# Patient Record
Sex: Female | Born: 1970 | Race: White | Hispanic: Yes | Marital: Married | Smoking: Never smoker
Health system: Southern US, Community
[De-identification: ages and names within clinical notes are randomized; demographics above are authoritative.]

## PROBLEM LIST (undated history)

## (undated) DIAGNOSIS — G40909 Epilepsy, unspecified, not intractable, without status epilepticus: Secondary | ICD-10-CM

## (undated) DIAGNOSIS — R569 Unspecified convulsions: Secondary | ICD-10-CM

## (undated) HISTORY — DX: Epilepsy, unspecified, not intractable, without status epilepticus: G40.909

## (undated) HISTORY — PX: NO PAST SURGERIES: SHX2092

---

## 2005-04-19 ENCOUNTER — Ambulatory Visit: Payer: Self-pay | Admitting: *Deleted

## 2005-04-19 ENCOUNTER — Encounter (INDEPENDENT_AMBULATORY_CARE_PROVIDER_SITE_OTHER): Payer: Self-pay | Admitting: *Deleted

## 2005-04-21 ENCOUNTER — Ambulatory Visit: Payer: Self-pay | Admitting: *Deleted

## 2005-04-21 ENCOUNTER — Ambulatory Visit (HOSPITAL_COMMUNITY): Admission: RE | Admit: 2005-04-21 | Discharge: 2005-04-21 | Payer: Self-pay | Admitting: *Deleted

## 2005-04-26 ENCOUNTER — Ambulatory Visit (HOSPITAL_COMMUNITY): Admission: RE | Admit: 2005-04-26 | Discharge: 2005-04-26 | Payer: Self-pay | Admitting: *Deleted

## 2005-05-02 ENCOUNTER — Inpatient Hospital Stay (HOSPITAL_COMMUNITY): Admission: AD | Admit: 2005-05-02 | Discharge: 2005-05-05 | Payer: Self-pay | Admitting: Obstetrics and Gynecology

## 2005-05-03 ENCOUNTER — Encounter (INDEPENDENT_AMBULATORY_CARE_PROVIDER_SITE_OTHER): Payer: Self-pay | Admitting: Specialist

## 2005-05-03 ENCOUNTER — Encounter: Payer: Self-pay | Admitting: Neurology

## 2005-05-14 ENCOUNTER — Ambulatory Visit: Payer: Self-pay | Admitting: Certified Nurse Midwife

## 2005-05-14 ENCOUNTER — Inpatient Hospital Stay (HOSPITAL_COMMUNITY): Admission: AD | Admit: 2005-05-14 | Discharge: 2005-05-20 | Payer: Self-pay | Admitting: Family Medicine

## 2005-05-15 ENCOUNTER — Ambulatory Visit: Payer: Self-pay | Admitting: Critical Care Medicine

## 2005-05-18 ENCOUNTER — Ambulatory Visit: Payer: Self-pay | Admitting: Certified Nurse Midwife

## 2005-05-24 ENCOUNTER — Ambulatory Visit: Payer: Self-pay | Admitting: Family Medicine

## 2005-06-28 ENCOUNTER — Ambulatory Visit: Payer: Self-pay | Admitting: Obstetrics & Gynecology

## 2007-02-16 ENCOUNTER — Emergency Department (HOSPITAL_COMMUNITY): Admission: EM | Admit: 2007-02-16 | Discharge: 2007-02-16 | Payer: Self-pay | Admitting: Emergency Medicine

## 2007-05-24 IMAGING — US US OB COMP +14 WK
1 series · 12 of 28 positions shown · non-contrast
Comparison: none

CLINICAL DATA: Anatomic exam.

[Series 1: us ob comp +14 wk · 0.33mm/px · 12 of 76 slices shown]
[im 3/76]
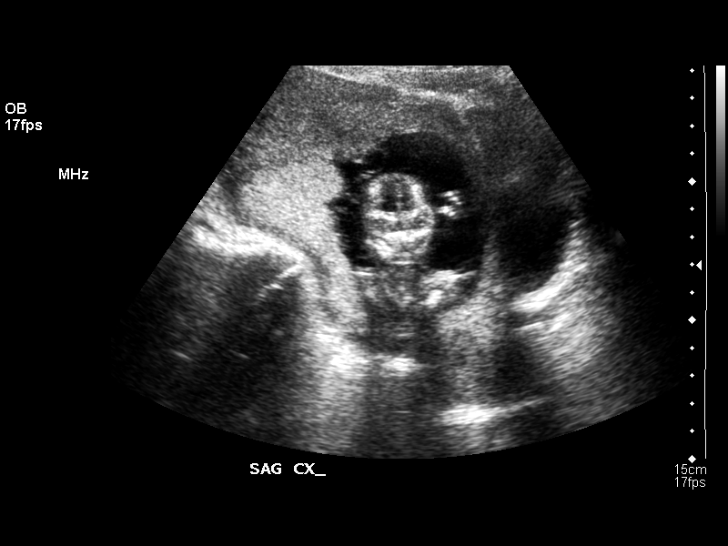
[im 9/76]
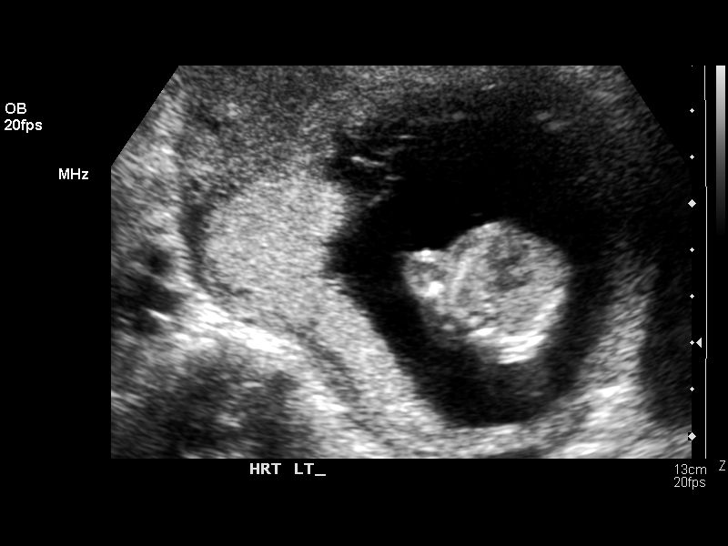
[im 14/76]
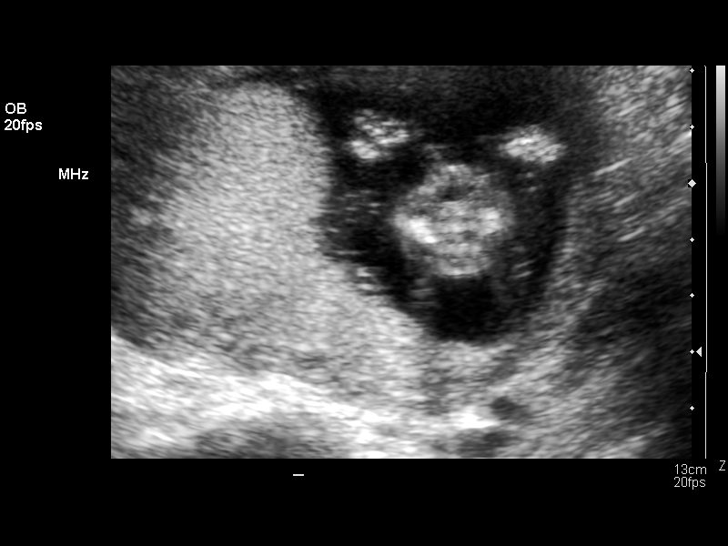
[im 23/76]
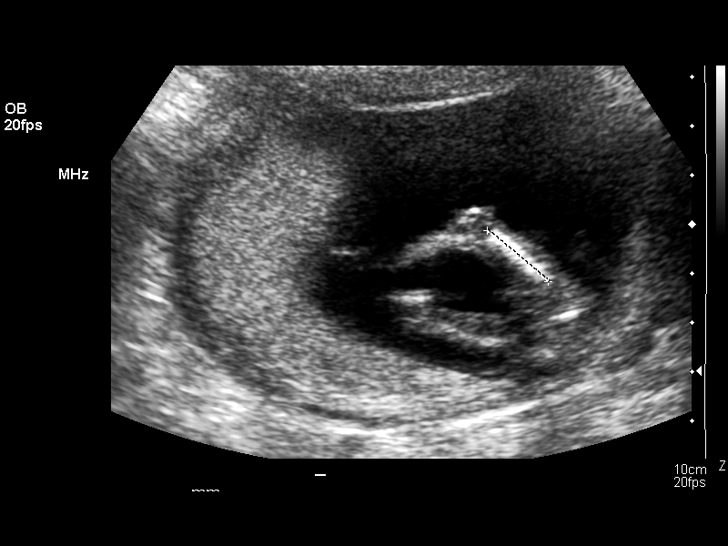
[im 28/76]
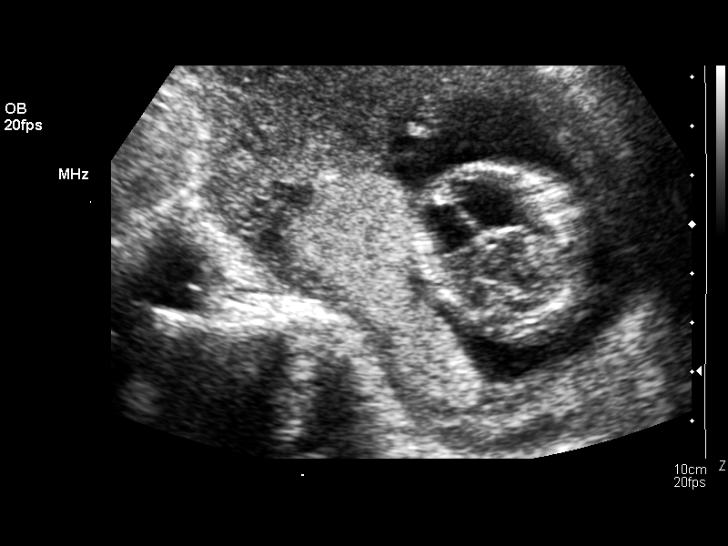
[im 34/76]
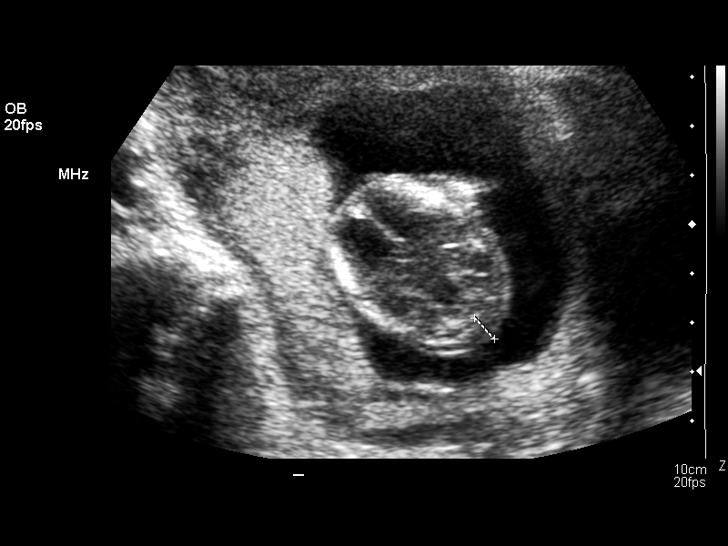
[im 42/76]
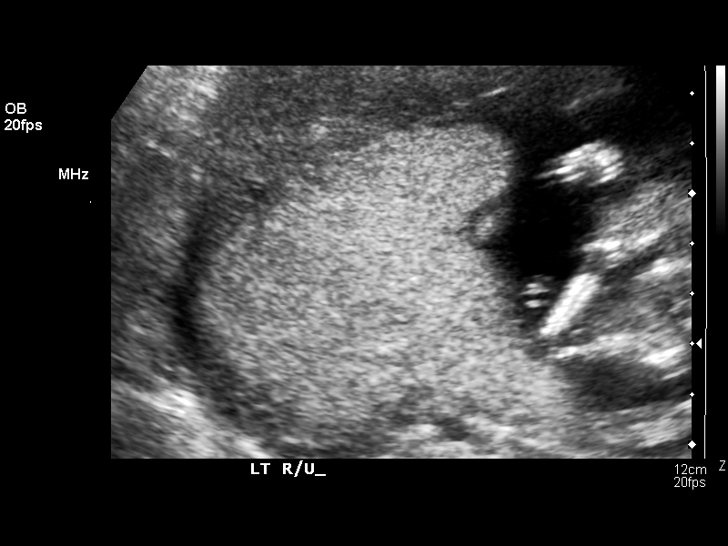
[im 48/76]
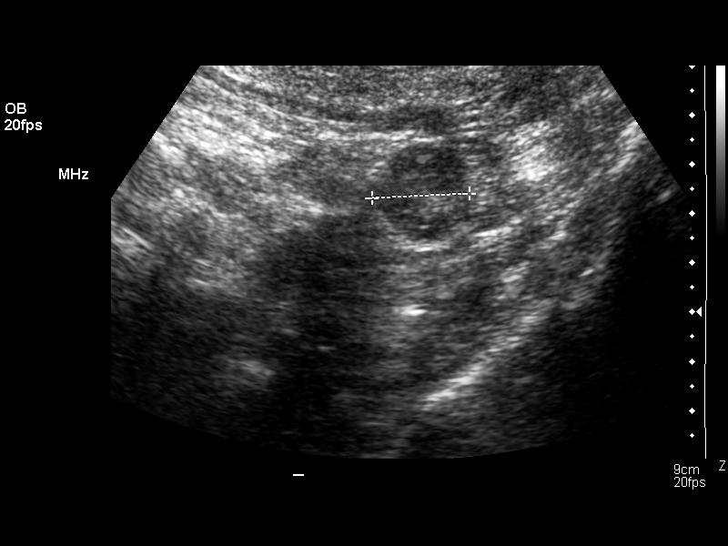
[im 53/76]
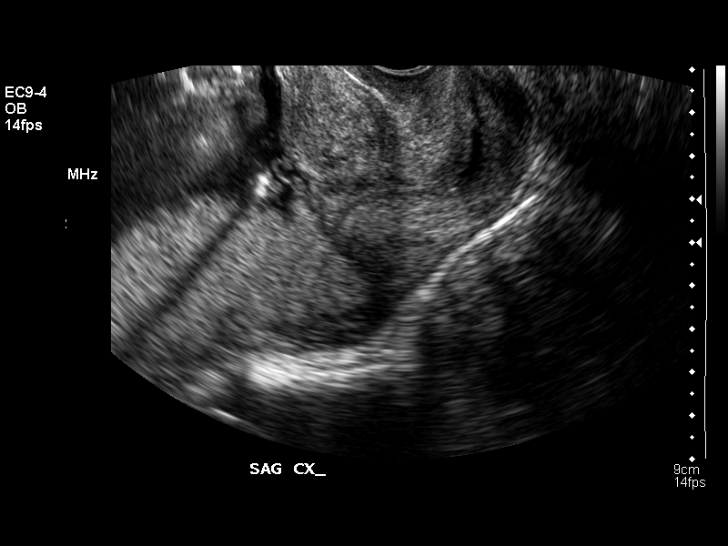
[im 62/76]
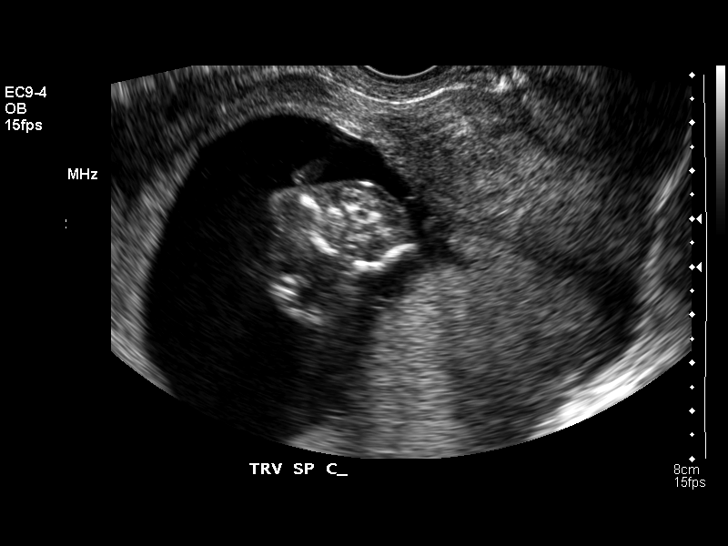
[im 67/76]
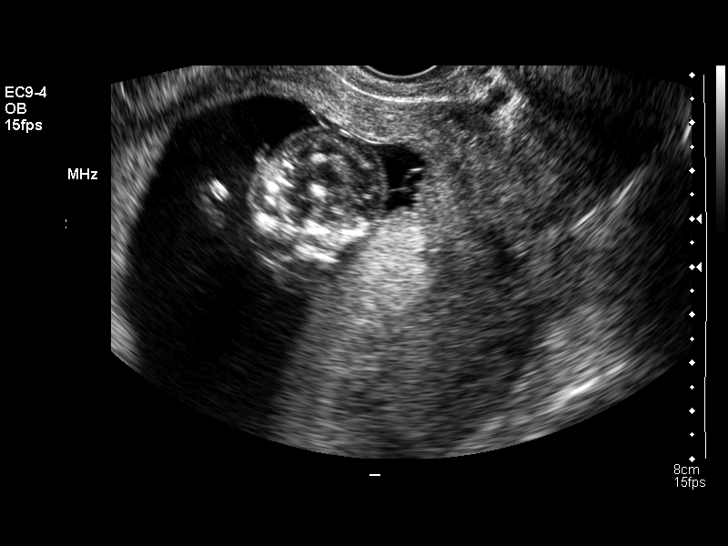
[im 73/76]
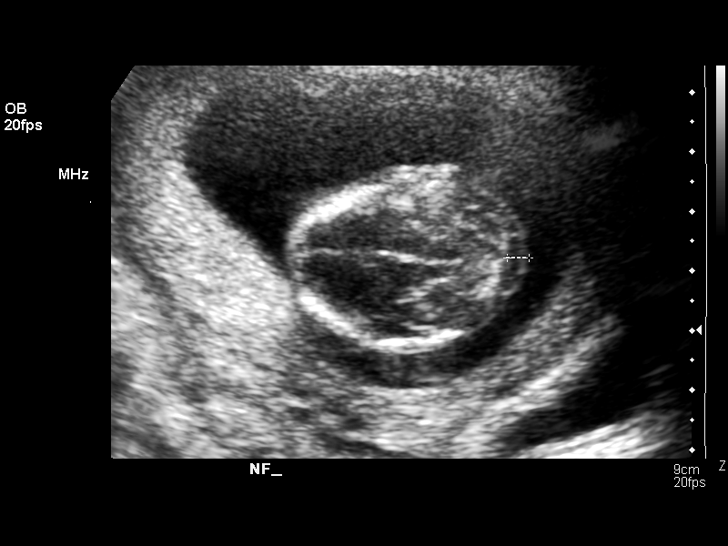

[12 of 28 positions shown; findings below may reference images not displayed]

OBSTETRICAL ULTRASOUND AND TRANSVAGINAL OB US:
Number of Fetuses:  1
Heart Rate:  153
Movement:  Yes
Breathing:  No  
Presentation:  Variable
Placental Location:  Posterior
Grade:  I
Previa:  No
Amniotic Fluid (Subjective):  Normal
Amniotic Fluid (Objective):   4.4 cm Vertical pocket 

FETAL BIOMETRY
BPD:  2.6 cm   14 w 5 d
HC:  10.9 cm  15 w 2 d
AC:    9.0 cm  15 w 2 d
FL:    1.6 cm  14 w 6 d

MEAN GA:  15 w 0 d

FETAL ANATOMY
Lateral Ventricles:    Abnormal
Thalami/CSP:      Not visualized 
Posterior Fossa:  Abnormal
Nuchal Region:    Not visualized 
Spine:      Not visualized 
4 Chamber Heart on Left:      Not visualized 
Stomach on Left:      Visualized 
3 Vessel Cord:    Not visualized 
Cord Insertion site:    Visualized 
Kidneys:  Visualized 
Bladder:  Visualized 
Extremities:      Visualized 

ADDITIONAL ANATOMY VISUALIZED:  Diaphragm and male genitalia.
Comments:  Noted is the presence of dilatation of the lateral and 3rd ventricles.  An abnormal morphology to the posterior fossa could be seen endovaginally with compression of the cerebellum and an absent cisterna magna.  The calvarial shape shows concavity of the frontal bones bilaterally.  

Evaluation limited by:  Early gestational age 

MATERNAL UTERINE AND ADNEXAL FINDINGS
Cervix:   Not evaluated.  Normal ovaries.
IMPRESSION: 1.  Single intrauterine pregnancy demonstrating an estimated gestational age by ultrasound of 15 weeks and 0 days.  This is one week behind expected estimated gestational age by LMP of 16 weeks and 0 days.  Fetal parameters correlate well with today?s composite estimated gestational age.  
2.  A limited anatomic assessment was possible due to the early gestational age.  Transabdominal images suggested the presence of dilated lateral ventricles.  Because of poor transabdominal scanning and fetal position, an attempt to improve anatomic visualization was undertaken with a transvaginal scanning.  This reveals the presence of dilatation of the lateral ventricles, as well as the 3rd ventricle.  An abnormal shape to the posterior fossa is seen with compression of the posterior fossa and obliteration of the cisterna magna.  As well the calvarial shape is abnormal with concavity of the frontal bones bilaterally. These head findings are compatible withan Arnold Chiari malformation and changes associated with a neural tube defect.  Scanning of the spine was suboptimal endovaginally and transabdominal visualization of the spine was hampered by lateral fetal positioning.  Of note was the presence of normal foot movement with no signs of club foot on either side.   The remainder of the anatomic visualization was limited by early gestation.  The patient has been scheduled to return in one week for rescanning and hopeful improvement in anatomic visualization.  
3.  Because of today?s scan findings this study was called and discussed with Dr. Boamah.  The patient was sent to Clinic immediately following this evaluation.

## 2007-05-29 IMAGING — US US OB DETAIL+14 WK
1 series · 13 of 28 positions shown · non-contrast
Comparison: 04/21/05.

CLINICAL DATA: 15 week 5 day gestational age by prior ultrasound.  Probable early hydrocephalus seen on prior ultrasound.  Evaluate detailed anatomy.  

DETAILED OBSTETRICAL ULTRASOUND:

[Series 1: us ob detail+14 wk · 0.18mm/px · 13 of 90 slices shown]
[im 4/90]
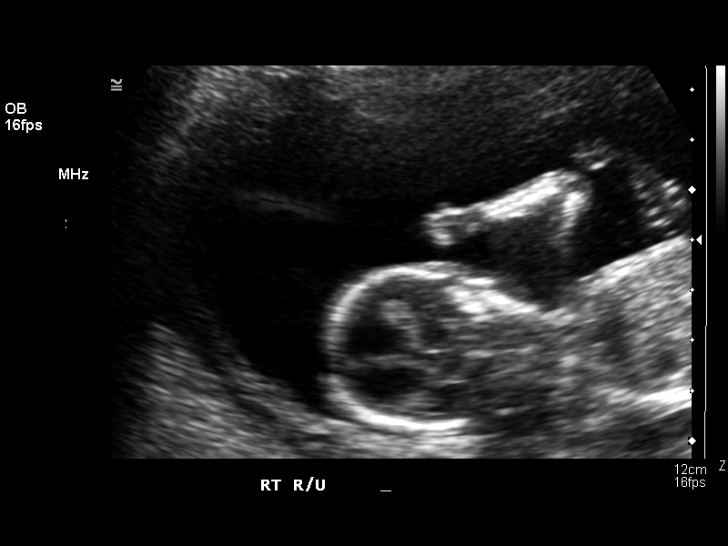
[im 10/90]
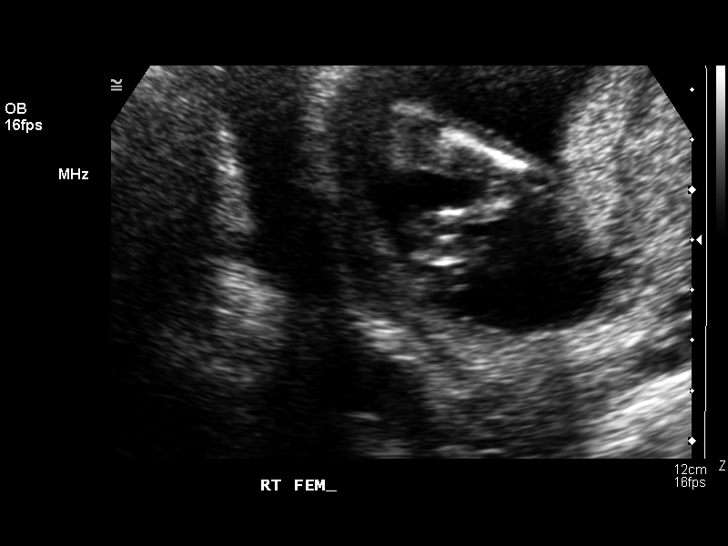
[im 17/90]
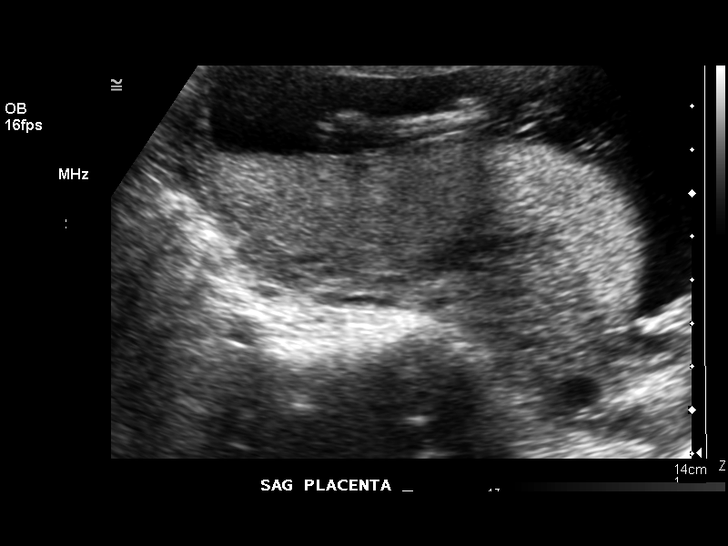
[im 24/90]
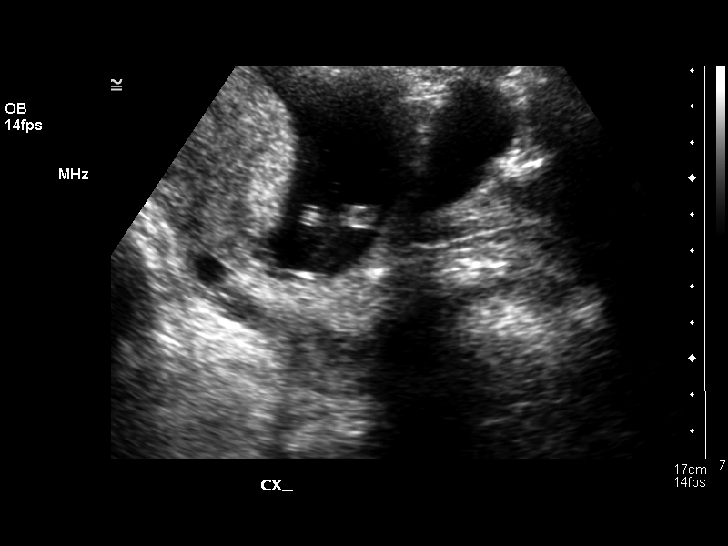
[im 30/90]
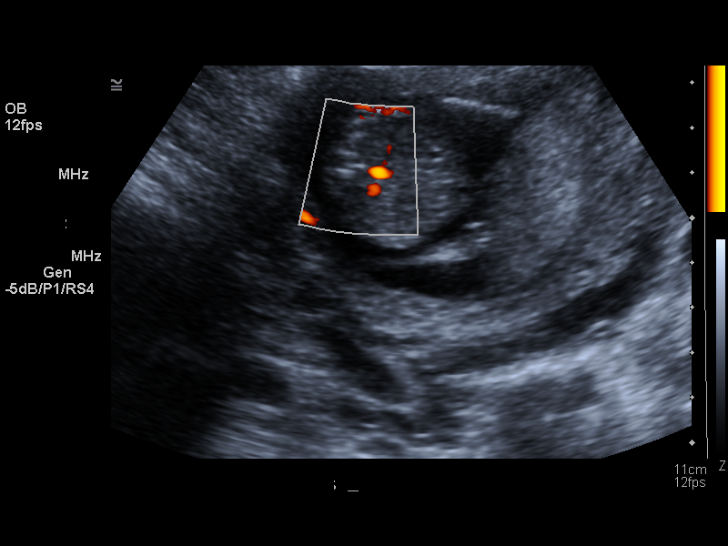
[im 37/90]
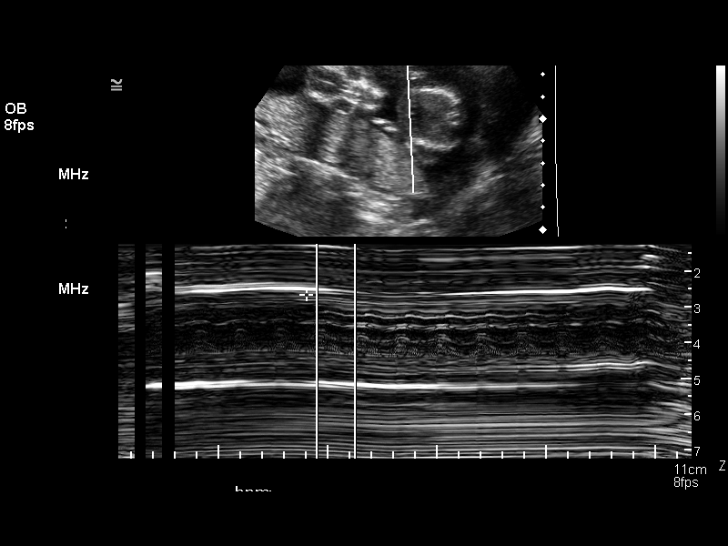
[im 47/90]
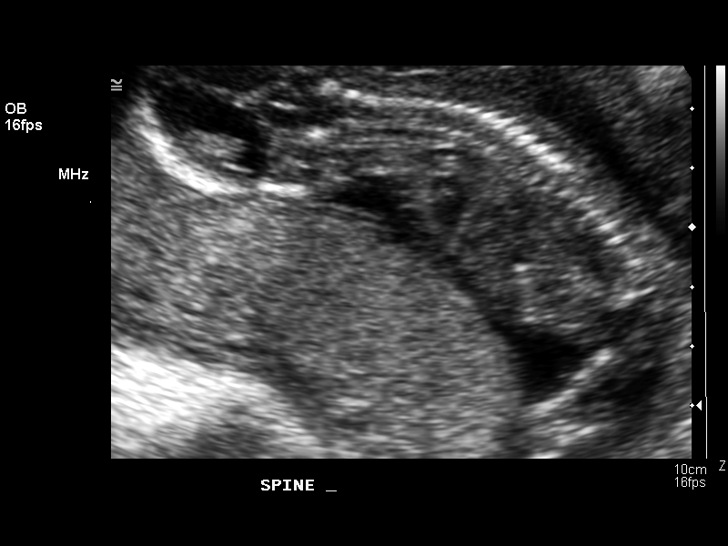
[im 53/90]
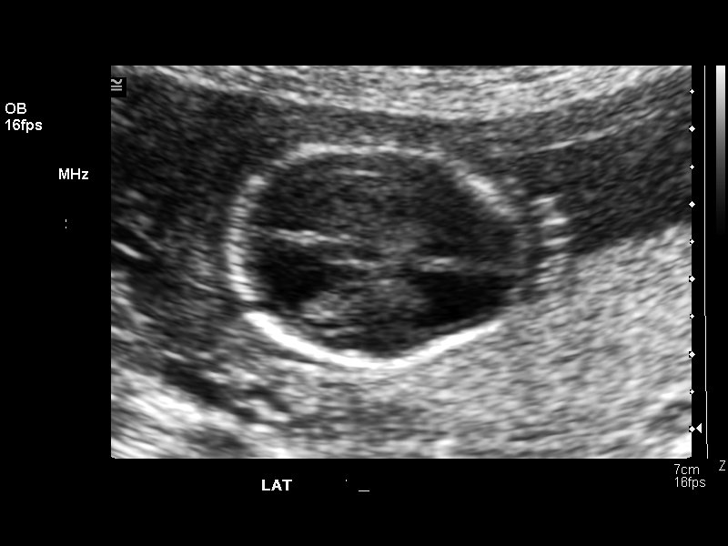
[im 60/90]
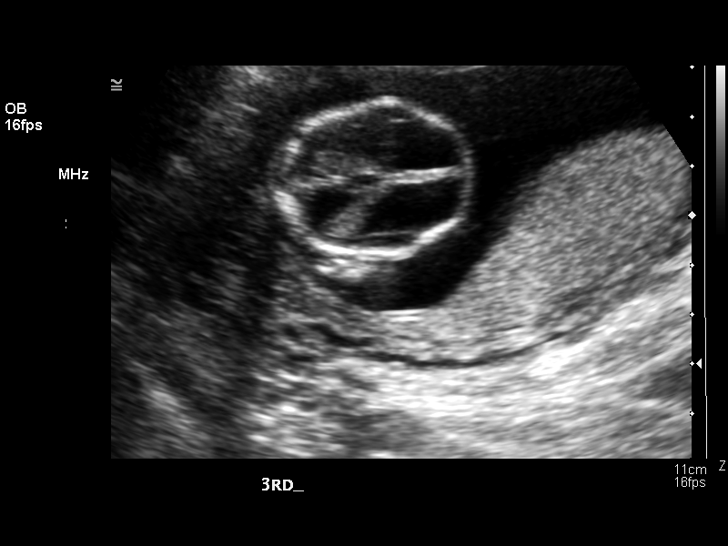
[im 66/90]
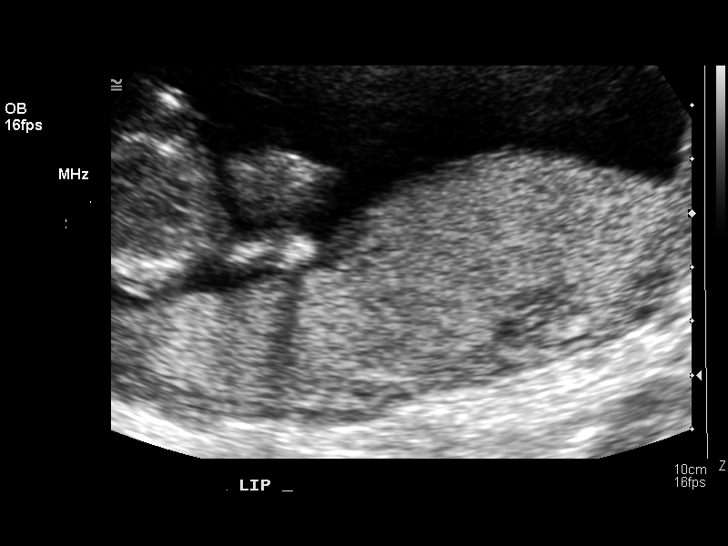
[im 73/90]
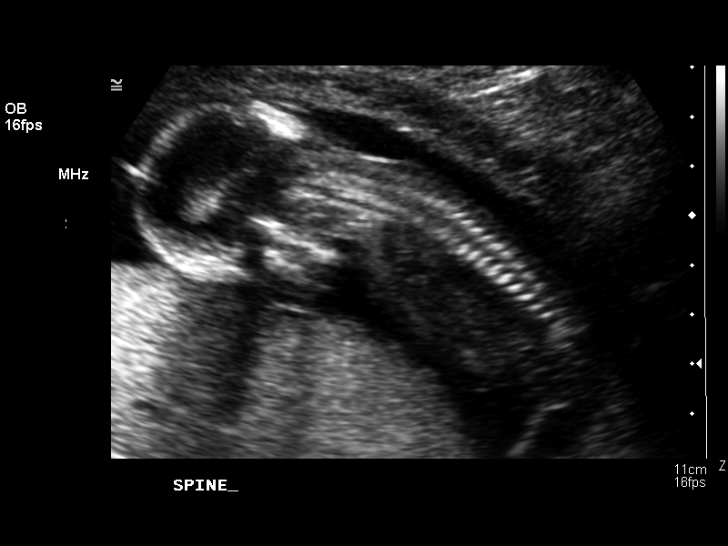
[im 80/90]
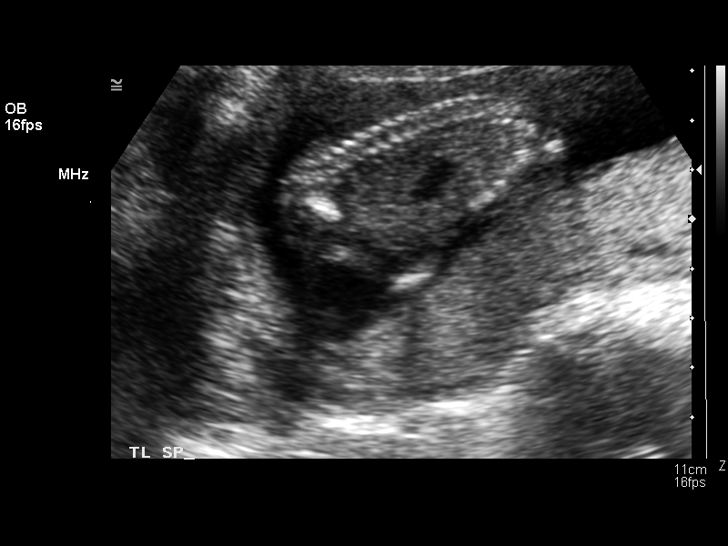
[im 86/90]
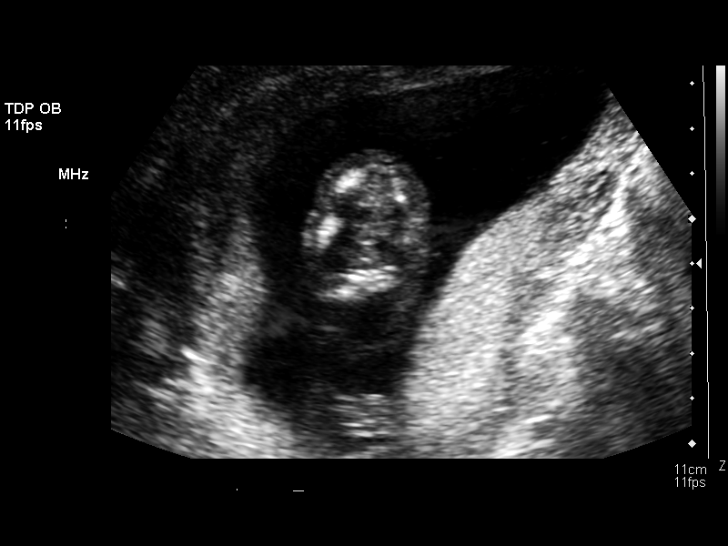

[13 of 28 positions shown; findings below may reference images not displayed]

Number of Fetuses:  1
Heart Rate:  171
Movement:  Yes
Breathing:  No
Presentation:  Breech
Placental Location:  Posterior
Grade:  I
Previa:  No
Amniotic Fluid (Subjective):  Normal
Amniotic Fluid (Objective):  3.5 cm Vertical pocket 
Fetal measurements were not requested on today?s exam.
FETAL ANATOMY
Lateral Ventricles:  Dilated with Fess sign noted
Thalami/CSP:    Visualized 
Posterior Fossa:  Abnormal with banana sign noted
Nuchal Region:    Visualized 
Spine:  osterior elements not well visualized due to lack of ossification at this gestational age, however small sacral meningomyelocele noted.
4 Chamber Heart on Left:  Not visualized   
Stomach on Left:  Visualized 
3 Vessel Cord:  Visualized 
Cord Insertion Site:  Visualized 
Kidneys:  Visualized 
Bladder:  Visualized 
Extremities:  Visualized 

ADDITIONAL ANATOMY VISUALIZED:  LVOT, upper lip, orbits, profile, diaphragm, heel, 5th digit, aortic arch, and male genitalia.
Comments:  The above findings are consistent with Arnold Chiari malformation.  

Evaluation limited by:  Early gestational age 

MATERNAL UTERINE AND ADNEXAL FINDINGS
Cervix: 3.0 cm Transabdominally
IMPRESSION: 1.  Single living intrauterine fetus.  Normal amniotic fluid volume.
2.  Arnold-Chiari malformation with small sacral meningomyelocele noted.  Assessment of the distal spine is still limited by early gestational age, and followup ultrasound evaluation is recommended in 2-3 weeks.  These findings were discussed with Dr. Jn Eddy.

## 2007-12-26 ENCOUNTER — Emergency Department (HOSPITAL_COMMUNITY): Admission: EM | Admit: 2007-12-26 | Discharge: 2007-12-26 | Payer: Self-pay | Admitting: Emergency Medicine

## 2010-06-05 ENCOUNTER — Encounter: Payer: Self-pay | Admitting: *Deleted

## 2010-06-05 ENCOUNTER — Encounter: Payer: Self-pay | Admitting: Neurology

## 2010-10-01 NOTE — Group Therapy Note (Signed)
[name], [name]     ACCOUNT NO.:  [id number]   MEDICAL RECORD NO.:  [id number]          PATIENT TYPE:  WOC   LOCATION:  [facility]                   FACILITY:  [facility]   PHYSICIAN:  Elsie Lincoln, MD      DATE OF BIRTH:  1970/07/13   DATE OF SERVICE:                                    CLINIC NOTE   Patient is a 40 year old female, who underwent a therapeutic abortion for  severe spina bifida.  The patient also has a history of epilepsy.  After her  miscarriage, she developed pyelonephritis and questionable ARDS.  She  recovered well and is now here for her IUD.  The patient has no history of  sexually transmitted disease and is in a monogamous relationship.  She  understands that this has a failure rate and also understands she needs to  do string checks every month.  This also does not preclude her from annual  exams.  She is also interested in permanent sterilization and getting in on  the waiting list at the [facility].  The uterus was anteverted, sounded  to 7 cm and then the Mirena was placed under sterile conditions.  The  strings cut to 2 to 3 cm.  The patient is to come back in a year for her  yearly Pap and female exam, and again the patient is to do monthly string  checks.           ______________________________  Elsie Lincoln, MD     KL/MEDQ  D:  06/28/2005  T:  06/28/2005  Job:  [id number]

## 2010-10-01 NOTE — Group Therapy Note (Signed)
[name], [name]     ACCOUNT NO.:  [id number]   MEDICAL RECORD NO.:  [id number]          PATIENT TYPE:  WOC   LOCATION:  [facility]                   FACILITY:  [facility]   PHYSICIAN:  Kathlyn Sacramento, M.D.   DATE OF BIRTH:  28-Jun-1970   DATE OF SERVICE:  05/24/2005                                    CLINIC NOTE   CHIEF COMPLAINT:  Follow-up from hospital stay.   HISTORY OF PRESENT ILLNESS:  The patient is a 40 year old Hispanic female,  who on May 03, 2005 had a therapeutic AB for a congenital anomaly of  her baby.  Her hospital course was complicated by some left leg numbness and  pain after the epidural.  She did have a neuro consult at that time, as well  as an anesthesia consult, which determined that it will resolve with time.  The patient was re-admitted for bilateral pyelonephritis on May 14, 2005 and discharged on May 20, 2005.  That hospital course was  complicated by urosepsis and pneumonia.  The patient also seemed to have  some anxiety associated with both hospitalizations, as well as the loss of  her child.  Patient states that emotionally she is doing well secondary to  the support of her family and friends.  She does have a primary care  physician, who she is following up with at the end of this month and stated  that she will discuss this with him as well.  She states that her bleeding  is less than lochia.  She desires an IUD for contraception.  She has used an  IUD in the past.   ALLERGIES:  NO KNOWN DRUG ALLERGIES.   LMP miscarriage May 03, 2005.  Last Pap smear April 19, 2005.   MEDICATIONS:  1.  Prenatal vitamins.  2.  Depakote.  3.  Tegretol.   PHYSICAL EXAMINATION:  VITAL SIGNS: Temp 97.1, pulse 91, blood pressure  98/63, weight 109.4, height 5 foot.  GENERAL:  Well-developed, well-nourished female in no acute distress.  CARDIOVASCULAR:  Normal S1 and S2.  No murmurs, gallops, or rubs.  ABDOMEN:  Positive bowel sounds,  soft, nontender, fundus firm below the  umbilicus, no CVA tenderness.   IMPRESSION:  1.  Status post therapeutic abortion.  2.  Status post pyelonephritis.  3.  Possible anxiety.  4.  Desires contraception.   PLAN:  1.  Patient is doing well after her therapeutic AB.  2.  Pyelo.  Patient is no long symptomatic and has continued her antibiotics      as instructed.  3.  Psych.  Patient is to follow-up with her primary care physician to      discuss this in further detail.  4.  Family planning.  Patient has completed an application for the Mirena      assistance program and Edda will call her to tell her if this is      approved, and if so we will have her follow-up in clinic.           ______________________________  Kathlyn Sacramento, M.D.     AC/MEDQ  D:  05/24/2005  T:  05/24/2005  Job:  [id number]

## 2010-10-01 NOTE — Consult Note (Signed)
[name], [name]     ACCOUNT NO.:  [id number]   MEDICAL RECORD NO.:  [id number]          PATIENT TYPE:  INP   LOCATION:  9372                          [facility]ILITY[facility]   PHYSICIAN:  Catherine A. Orlin Hilding, M.D.DATE OF BIRTH:  07-06-1970   DATE OF CONSULTATION:  05/03/2005  DATE OF DISCHARGE:                                   CONSULTATION   CHIEF COMPLAINT:  Left leg pain, numbness and weakness following epidural.   HISTORY OF PRESENT ILLNESS:  [name] is a 40 year old Hispanic  woman, who was admitted on May 02, 2005 for induction of labor for a 17-  week intrauterine fetal demise with dysraphic state, which incidentally  might be related to her Depakote use for seizure disorder.  She received an  epidural yesterday and at the time of insertion of the catheter, she  complained of some shooting pain down her left leg that subsided.  Since  delivery, however, the right leg has recovered normally, but the patient is  still complaining of pain, numbness, and weakness in the left leg,  particularly in the left heel.   REVIEW OF SYSTEMS:  Of a multisystem review of systems including cardiac,  pulmonary, neurologic, hematologic, endocrine, GI, GU, musculoskeletal, ENT,  reproductive, STD, skin, mucosa, pain, sleep and nutrition, which is a 15-  point review of systems, only the following are positive:  History of  trichomonas and seizures; none recently.   PAST MEDICAL HISTORY:  Significant for remote seizure disorder.  No recent  seizures, well controlled on Tegretol and Depakote.  She has a remote  history of C-section and ovarian cyst surgery, and this recent intrauterine  fetal demise with neural tube defects with delivery yesterday.   MEDICATIONS:  Cytotec, ephedrine, Depakote, and Tegretol.   ALLERGIES:  NO KNOWN DRUG ALLERGIES.   SOCIAL HISTORY:  She is single, the father of the baby has not been  involved, and there is no tobacco or alcohol use.   FAMILY HISTORY:  Noncontributory.   PHYSICAL EXAMINATION:  VITAL SIGNS:  Temperature is 98.4, pulse 80, BP is  95/55.  HEENT:  Head is normocephalic, atraumatic.  NECK:  Supple.  NEUROLOGIC EXAMINATION:  Mental Status:  She is awake and alert, and  cooperative. Cranial Nerves:  Visual fields are full to confrontation.  Extraocular movements are intact.  Facial sensation is normal.  Facial motor  activity is normal.  Hearing is intact.  Palate is symmetric and tongue is  midline.  Motor Exam:  Normal upper extremity bilaterally and normal right  lower extremity.  The left lower extremity is somewhat variable.  She seems  to have a vigorous drift with her left almost being flung to the bed when  you hold it up and let go, yet she has 4+/5 psoas and quadriceps.  There is  some limitation of foot dorsiflexion and plantar flexion.  Hamstrings are  normal.  Deep tendon reflexes are 2+ with downgoing toes.  Coordination:  Finger-to-nose and heel-to-shin are normal.  Sensory:  Normal.   IMPRESSION:  Residual left pain and paresthesias and weakness following an  epidural.  She did have some initial radicular pain,  indicating the  possibility that there is some damage to the peripheral nerve or a root, and  we also have to consider epidural hematoma.  However, some elements of the  exam are functional.   RECOMMENDATIONS:  Would get an MRI of the lumbar spine to rule out an  epidural hematoma.  If that is negative, would simply just give it time and  encouragement.      Catherine A. Orlin Hilding, M.D.  Electronically Signed     CAW/MEDQ  D:  05/03/2005  T:  05/03/2005  Job:  [id number]   cc:   Caren Macadam., M.D.  Fax: [phone]   Phil D. Okey Dupre, M.D.

## 2010-10-01 NOTE — Discharge Summary (Signed)
[name], [name]     ACCOUNT NO.:  [id number]   MEDICAL RECORD NO.:  [id number]          PATIENT TYPE:  INP   LOCATION:  9317                          [facility]   PHYSICIAN:  Tanya S. Shawnie Pons, M.D.   DATE OF BIRTH:  Apr 30, 1971   DATE OF ADMISSION:  05/14/2005  DATE OF DISCHARGE:  05/20/2005                                 DISCHARGE SUMMARY   CONSULTATIONS:  Shan Levans, M.D.   ADMISSION DIAGNOSES:  1.  Status post therapeutic abortion on May 04, 2005.  2.  Low back pain.  3.  Headache.  4.  Fever.   DISCHARGE DIAGNOSES:  1.  Status post terminated pregnancy.  2.  Urosepsis, resolved.  3.  Pneumonia.  4.  Urinary tract infection.   HISTORY OF PRESENT ILLNESS:  The patient is a 40 year old G2, P1-0-1-1 who  terminated at her pregnancy on May 04, 2005 and states that she was  having a fever today as well as low back pain, headache and anxiety.   MEDICATIONS:  Her medications include Depakote, Tegretol, ibuprofen,  prenatal vitamins.   OBSTETRICAL HISTORY:  Cesarean section in 1994, TAB May 04, 2005 which  was complicated by leg and foot pain which was felt to be related to her  epidural.  History of ovarian cyst in 2005.   PAST MEDICAL HISTORY:  Epilepsy.   PAST SURGICAL HISTORY:  Cesarean section in 1994.   ALLERGIES:  None.   SOCIAL HISTORY:  Father of baby not involved.   PHYSICAL EXAMINATION:  VITAL SIGNS:  Her temperature was 100.2, her pulse  was 120, respiratory rate was 20 to 40, blood pressure 92/54.  ABDOMEN:  Soft, tender over her abdomen.  CHEST:  She had a regular rhythm and rate.  MUSCULOSKELETAL:  Tender in the low back.  PELVIC:  She did have cervical motion tenderness.  Her external genitalia  were within normal limits.  SKIN:  Was warm to touch.   HOSPITAL COURSE:  The patient was admitted and did have a urinalysis  significant for a urinary tract infection and with costovertebral angle  tenderness as well it was  felt she had pyelo.  She was started on  antibiotics and she had an ultrasound of her pelvis to rule out retained  products of conception which was normal as well as having the CT scan.  The  CT scan was significant for severe bilateral pyelonephritis.  The patient  was changed to Zosyn after recommendations from pulmonology.  Pulmonology  was consulted secondary to respiratory distress.  The patient was started on  albuterol and given incentive spirometry. It was felt that she had an acute  lung injury from her urosepsis versus pneumonia.  She was continued on  antibiotics and her pain resolved as well as her respiratory distress.  She  remained stable and afebrile.  The patient was discharged to home with close  follow up.  It was felt the patient had a lot of anxiety during this  hospitalization and her previous hospitalization and was encouraged to  follow up with her primary care physician and discuss this with him.   DISCHARGE INSTRUCTIONS:  The  patient was discharged to home.   DIET:  Regular.   ACTIVITY:  Regular.   FOLLOW UP:  She is to follow up with [facility] who is her primary  care physician on June 06, 2005 at 2:00 P.M.  She is also to follow up  with [facility] on May 24, 2005 at 2:00 P.Lesle Chris MEDICATIONS:  1.  Bactrim double strength, one p.o. b.i.d. #14 with no refills.  2.  Tylenol #3, one p.o. q.6h. PRN pain, #20, no refills.     ______________________________  August Saucer Merlene Morse, MD    ______________________________  Shelbie Proctor. Shawnie Pons, M.D.    ABC/MEDQ  D:  05/20/2005  T:  05/20/2005  Job:  [id number]   cc:   [facility], Phone [phone]

## 2011-02-11 LAB — URINALYSIS, ROUTINE W REFLEX MICROSCOPIC
Bilirubin Urine: NEGATIVE
Glucose, UA: NEGATIVE
Hgb urine dipstick: NEGATIVE
Ketones, ur: NEGATIVE
Nitrite: NEGATIVE
Specific Gravity, Urine: 1.018
pH: 7.5

## 2011-02-11 LAB — POCT I-STAT, CHEM 8
BUN: 11
Chloride: 107
Creatinine, Ser: 0.6
Potassium: 3.1 — ABNORMAL LOW
Sodium: 137
TCO2: 24

## 2011-02-11 LAB — CARBAMAZEPINE LEVEL, TOTAL: Carbamazepine Lvl: 3.4 — ABNORMAL LOW

## 2011-02-11 LAB — PREGNANCY, URINE: Preg Test, Ur: NEGATIVE

## 2011-02-24 LAB — RAPID URINE DRUG SCREEN, HOSP PERFORMED
Amphetamines: NOT DETECTED
Barbiturates: NOT DETECTED
Benzodiazepines: NOT DETECTED
Cocaine: NOT DETECTED
Opiates: NOT DETECTED
Tetrahydrocannabinol: NOT DETECTED

## 2011-02-24 LAB — BASIC METABOLIC PANEL
CO2: 28
Chloride: 104
Creatinine, Ser: 0.64
GFR calc Af Amer: >60
Glucose, Bld: 90
Sodium: 137

## 2011-02-24 LAB — URINALYSIS, ROUTINE W REFLEX MICROSCOPIC
Bilirubin Urine: NEGATIVE
Glucose, UA: NEGATIVE
Hgb urine dipstick: NEGATIVE
Protein, ur: NEGATIVE
Urobilinogen, UA: 0.2

## 2011-02-24 LAB — PREGNANCY, URINE: Preg Test, Ur: NEGATIVE

## 2011-02-24 LAB — VALPROIC ACID LEVEL: Valproic Acid Lvl: 66.3

## 2013-08-14 ENCOUNTER — Emergency Department (HOSPITAL_COMMUNITY): Payer: Managed Care, Other (non HMO)

## 2013-08-14 ENCOUNTER — Encounter (HOSPITAL_COMMUNITY): Payer: Self-pay | Admitting: Emergency Medicine

## 2013-08-14 ENCOUNTER — Emergency Department (HOSPITAL_COMMUNITY)
Admission: EM | Admit: 2013-08-14 | Discharge: 2013-08-15 | Disposition: A | Discharge status: Home / Self Care | Payer: Managed Care, Other (non HMO) | Attending: Emergency Medicine | Admitting: Emergency Medicine

## 2013-08-14 DIAGNOSIS — R569 Unspecified convulsions: Secondary | ICD-10-CM

## 2013-08-14 DIAGNOSIS — Z3202 Encounter for pregnancy test, result negative: Secondary | ICD-10-CM | POA: Insufficient documentation

## 2013-08-14 DIAGNOSIS — F411 Generalized anxiety disorder: Secondary | ICD-10-CM | POA: Insufficient documentation

## 2013-08-14 DIAGNOSIS — R51 Headache: Secondary | ICD-10-CM | POA: Insufficient documentation

## 2013-08-14 HISTORY — DX: Unspecified convulsions: R56.9

## 2013-08-14 LAB — COMPREHENSIVE METABOLIC PANEL
ALT: 7 U/L (ref 0–35)
AST: 12 U/L (ref 0–37)
Albumin: 3.1 g/dL — ABNORMAL LOW (ref 3.5–5.2)
Alkaline Phosphatase: 70 U/L (ref 39–117)
BUN: 9 mg/dL (ref 6–23)
CALCIUM: 8.8 mg/dL (ref 8.4–10.5)
CO2: 24 mEq/L (ref 19–32)
CREATININE: 0.54 mg/dL (ref 0.50–1.10)
Chloride: 100 mEq/L (ref 96–112)
GLUCOSE: 88 mg/dL (ref 70–99)
Potassium: 3.4 mEq/L — ABNORMAL LOW (ref 3.7–5.3)
Sodium: 134 mEq/L — ABNORMAL LOW (ref 137–147)
TOTAL PROTEIN: 6.6 g/dL (ref 6.0–8.3)
Total Bilirubin: <0.2 mg/dL — ABNORMAL LOW (ref 0.3–1.2)

## 2013-08-14 LAB — POC URINE PREG, ED: PREG TEST UR: NEGATIVE

## 2013-08-14 LAB — CBC
HEMATOCRIT: 35.4 % — AB (ref 36.0–46.0)
HEMOGLOBIN: 12.1 g/dL (ref 12.0–15.0)
MCH: 30.5 pg (ref 26.0–34.0)
MCHC: 34.2 g/dL (ref 30.0–36.0)
MCV: 89.2 fL (ref 78.0–100.0)
Platelets: 178 10*3/uL (ref 150–400)
RBC: 3.97 MIL/uL (ref 3.87–5.11)
RDW: 12.6 % (ref 11.5–15.5)
WBC: 8.8 10*3/uL (ref 4.0–10.5)

## 2013-08-14 LAB — CBG MONITORING, ED
Glucose-Capillary: 78 mg/dL (ref 70–99)
Glucose-Capillary: 81 mg/dL (ref 70–99)

## 2013-08-14 MED ORDER — ACETAMINOPHEN 325 MG PO TABS
650.0000 mg | ORAL_TABLET | Freq: Once | ORAL | Status: DC
  Filled 2013-08-14: qty 2

## 2013-08-14 MED ORDER — LORAZEPAM 2 MG/ML IJ SOLN
1.0000 mg | Freq: Once | INTRAMUSCULAR | Status: AC
  Administered 2013-08-14: 1 mg via INTRAVENOUS

## 2013-08-14 MED ORDER — POTASSIUM CHLORIDE CRYS ER 20 MEQ PO TBCR
20.0000 meq | EXTENDED_RELEASE_TABLET | Freq: Once | ORAL | Status: AC
  Administered 2013-08-14: 20 meq via ORAL
  Filled 2013-08-14: qty 1

## 2013-08-14 NOTE — ED Notes (Signed)
Verified that blood is in lab.

## 2013-08-14 NOTE — ED Notes (Signed)
Bed: ON62WA16 Expected date:  Expected time:  Means of arrival:  Comments: ems- 43 yo non english speaking, seizures

## 2013-08-14 NOTE — ED Provider Notes (Signed)
CSN: [id number]     Arrival date & time 08/14/13  1744 History   First MD Initiated Contact with Patient 08/14/13 1758     Chief Complaint  Patient presents with  . Seizures     (Consider location/radiation/quality/duration/timing/severity/associated sxs/prior Treatment) Patient is a 43 y.o. female presenting with seizures. The history is provided by the patient and the EMS personnel. A language interpreter was used.  Seizures pt with hx seizure disorder (since age 20), c/o ?seizure pta, and seizure x 2 w ems.  Pt spanish speaking, interpreter used for history.  Pt with hx seizures x many years, and states often has headaches prior to and/or after seizures. Notes frontal headache w todays seizures. Constant, dull, moderate, c/w prior headaches. ems and staff note shaking/tremulousness of arms and legs, during which pt maintains normal level of consciousness, and is able to perform purposeful movements w both arms during episodes, including reaching out to grasp/hold caregivers hands. Pt incontinence. No oral injury.  No postictal period. States compliant w normal meds, no recent change in meds. No recent trauma or fall. No fever or chills.   Pt states typically has a few seizures per month, despite meds for same. No recent abrupt increase in sz frequency. No recent new stressors or illness.     Past Medical History  Diagnosis Date  . Seizures    No past surgical history on file. No family history on file. History  Substance Use Topics  . Smoking status: Not on file  . Smokeless tobacco: Not on file  . Alcohol Use: Not on file   OB History   Grav Para Term Preterm Abortions TAB SAB Ect Mult Living                 Review of Systems  Constitutional: Negative for fever and chills.  HENT: Negative for sore throat.   Eyes: Negative for redness.  Respiratory: Negative for cough and shortness of breath.   Cardiovascular: Negative for chest pain.  Gastrointestinal: Negative for vomiting,  abdominal pain and diarrhea.  Genitourinary: Negative for dysuria and flank pain.  Musculoskeletal: Negative for back pain and neck pain.  Skin: Negative for rash.  Neurological: Positive for seizures and headaches. Negative for weakness and numbness.  Hematological: Does not bruise/bleed easily.  Psychiatric/Behavioral: Negative for agitation.      Allergies  Review of patient's allergies indicates no known allergies.  Home Medications  No current outpatient prescriptions on file. BP 109/71  Pulse 86  Temp(Src) 98.5 F (36.9 C) (Oral)  Resp 14  SpO2 100% Physical Exam  Nursing note and vitals reviewed. Constitutional: She is oriented to person, place, and time. She appears well-developed and well-nourished. No distress.  HENT:  Head: Atraumatic.  Nose: Nose normal.  Mouth/Throat: Oropharynx is clear and moist.  No sinus or temporal tenderness.  Eyes: Conjunctivae and EOM are normal. Pupils are equal, round, and reactive to light. No scleral icterus.  Neck: Neck supple. No tracheal deviation present. No thyromegaly present.  No stiffness or rigidity.   Cardiovascular: Normal rate, regular rhythm, normal heart sounds and intact distal pulses.  Exam reveals no gallop and no friction rub.   No murmur heard. Pulmonary/Chest: Effort normal and breath sounds normal. No respiratory distress.  Abdominal: Soft. Normal appearance and bowel sounds are normal. She exhibits no distension. There is no tenderness.  Genitourinary:  No cva tenderness.  Musculoskeletal: Normal range of motion. She exhibits no edema and no tenderness.  Neurological: She is alert  and oriented to person, place, and time. No cranial nerve deficit.  Motor intact bilaterally. Steady gait.   Skin: Skin is warm and dry. No rash noted. She is not diaphoretic.  Psychiatric:  Mildly anxious    ED Course  Procedures (including critical care time)  Results for orders placed during the hospital encounter of  08/14/13  CBC      Result Value Ref Range   WBC 8.8  4.0 - 10.5 K/uL   RBC 3.97  3.87 - 5.11 MIL/uL   Hemoglobin 12.1  12.0 - 15.0 g/dL   HCT 16.1 (*) 09.6 - 04.5 %   MCV 89.2  78.0 - 100.0 fL   MCH 30.5  26.0 - 34.0 pg   MCHC 34.2  30.0 - 36.0 g/dL   RDW 40.9  81.1 - 91.4 %   Platelets 178  150 - 400 K/uL  COMPREHENSIVE METABOLIC PANEL      Result Value Ref Range   Sodium 134 (*) 137 - 147 mEq/L   Potassium 3.4 (*) 3.7 - 5.3 mEq/L   Chloride 100  96 - 112 mEq/L   CO2 24  19 - 32 mEq/L   Glucose, Bld 88  70 - 99 mg/dL   BUN 9  6 - 23 mg/dL   Creatinine, Ser 7.82  0.50 - 1.10 mg/dL   Calcium 8.8  8.4 - 95.6 mg/dL   Total Protein 6.6  6.0 - 8.3 g/dL   Albumin 3.1 (*) 3.5 - 5.2 g/dL   AST 12  0 - 37 U/L   ALT 7  0 - 35 U/L   Alkaline Phosphatase 70  39 - 117 U/L   Total Bilirubin <0.2 (*) 0.3 - 1.2 mg/dL   GFR calc non Af Amer >90  >90 mL/min   GFR calc Af Amer >90  >90 mL/min  CBG MONITORING, ED      Result Value Ref Range   Glucose-Capillary 78  70 - 99 mg/dL  POC URINE PREG, ED      Result Value Ref Range   Preg Test, Ur NEGATIVE  NEGATIVE  CBG MONITORING, ED      Result Value Ref Range   Glucose-Capillary 81  70 - 99 mg/dL   Ct Head Wo Contrast  08/14/2013   CLINICAL DATA:  Seizure, history of epilepsy  EXAM: CT HEAD WITHOUT CONTRAST  TECHNIQUE: Contiguous axial images were obtained from the base of the skull through the vertex without intravenous contrast.  COMPARISON:  None.  FINDINGS: No evidence of parenchymal hemorrhage or extra-axial fluid collection. No mass lesion, mass effect, or midline shift.  No CT evidence of acute infarction.  Cerebral volume is within normal limits.  No ventriculomegaly.  The visualized paranasal sinuses are essentially clear. The mastoid air cells are unopacified.  No evidence of calvarial fracture.  IMPRESSION: Normal head CT.   Electronically Signed   By: Charline Bills M.D.   On: 08/14/2013 18:53     MDM  Iv ns. Seizure precautions.    Continuous pulse ox and monitor.  Labs.  kcl sl low, kcl po.   As ?recurrent seizures in ED, will get ct.  Reviewed nursing notes and prior charts for additional history.   Tylenol po.  Recheck alert, content. No pain. No recurrent seizures.   Awaiting labs.   Recheck - no recurrent seizure. No c/o. No headache. No fevers. No nv. Tolerating po.  Plan for pcp/neurology referral/ follow up.   2330 depakote and tegretl levels  pending.  Signed out to Dr Dierdre Highmanpitz to check when back, if ok and no recurrent sz, may d/c to home.      Suzi RootsKevin E Tracia Lacomb, MD 08/14/13 [phone]

## 2013-08-14 NOTE — ED Notes (Addendum)
Pt does not speak any english, speaks spanish. Husband has been called and speaks english.Per ems pt is from work, staff report pt had 2 seizures prior to ems arrival. 2 more seizures in route with ems. 30 seconds each, extremity rigid and extremities shaking, not full body. postictal now. 2.5mg  versed given IV.  18 L AC. No oral trauma or incontinence noted.   translator on scene reported pt complained of a severe migraine before 1st seizure. And reported pt has medication in purse for seizures.

## 2013-08-14 NOTE — Discharge Instructions (Signed)
Continue your seizure medication.  No driving until cleared to do so by your doctor/neurologist. As your seizures appear quite atypical, follow up with neurologist in the next 1-2 weeks - see referral - call office to arrange appointment.  Discuss possible eeg and/or further evaluation.  Return to ER if worse, new symptoms, persistent/recurrent seizures, fevers, severe headache, other concern.     Convulsiones en el adulto ( Seizure, Adult) Neomia DearUna convulsin es una actividad elctrica anormal en el cerebro. Generalmente duran entre 30 segundos y 2 minutos. Hay varios tipos de convulsiones. Antes de una convulsin, puede tener una sensacin de advertencia (aura) que indica que la convulsin est a punto de Radiation protection practitionerocurrir. Un aura puede incluir los siguientes sntomas:   [place] o ansiedad.  Nuseas.  Sentir que la habitacin da vueltas (vrtigo).  Cambios en la visin, como ver destellos de luz o [place]. Los sntomas ms comunes durante un ataque son:  Un cambio en la atencin o el comportamiento (estado mental alterado).  Convulsiones con sacudidas rtmicas.  Babeo.  Movimientos rpidos de los ojos.  Gruidos.  Prdida del control del intestino y la vejiga.  Sabor amargo en la boca.  Mordeduras de Denairlengua. Despus de un ataque, puede ser que se sienta confundido y adormilado. Tambin podra sufrir una lesin durante la convulsin. INSTRUCCIONES PARA EL CUIDADO EN EL HOGAR   Si le recetaron medicamentos, tmelos exactamente segn se lo indique su mdico.  Cumpla con todas las visitas de control, segn le indique su mdico.  No nade ni conduzca ni participe en actividades Tenet Healthcareriesgosas durante las que una convulsin podra causarle una lesin mayor a usted o a otros hasta que el mdico lo autorice.  Descanse adecuadamente.  Ensee a sus amigos y familiares lo que debe hacer si tiene una convulsin. Ellos deben:  Acostarlo en el suelo para evitar que se caiga.  Poner una almohada  debajo de su cabeza.  Aflojar la ropa apretada alrededor de su cuello.  Recostarlo sobre un lado. En caso de tener vmitos, esto ayuda a CBS Corporationmantener las vas area despejadas.  Lennie HummerQuedarse con usted hasta que se recupere.  Sepa si necesita atencin de emergencia o no. SOLICITE ATENCIN MDICA DE INMEDIATO SI:  La convulsin dura ms de cinco minutos.  La convulsin es grave o no se despierta de inmediato despus de la convulsin.  Tiene un estado mental alterado despus de la convulsin.  Tiene convulsiones ms frecuentes o ms graves. Alguien debe llevarlo al departamento de emergencias o llamar a los servicios de emergencias locales (911 en [place] ASEGRESE DE QUE:  Comprende estas instrucciones.  Controlar su afeccin.  Recibir ayuda de inmediato si no mejora o si empeora. Document Released: 02/09/2005 Document Revised: 02/20/2013 [facility] Patient Information 2014 [employer], [place].

## 2013-08-15 LAB — VALPROIC ACID LEVEL: Valproic Acid Lvl: 54.4 ug/mL (ref 50.0–100.0)

## 2013-08-15 LAB — CARBAMAZEPINE LEVEL, TOTAL: CARBAMAZEPINE LVL: 5 ug/mL (ref 4.0–12.0)

## 2016-03-04 ENCOUNTER — Ambulatory Visit (INDEPENDENT_AMBULATORY_CARE_PROVIDER_SITE_OTHER): Payer: Managed Care, Other (non HMO) | Admitting: Physician Assistant

## 2016-03-04 ENCOUNTER — Ambulatory Visit (INDEPENDENT_AMBULATORY_CARE_PROVIDER_SITE_OTHER): Payer: Managed Care, Other (non HMO)

## 2016-03-04 VITALS — BP 116/70 | HR 81 | Temp 98.5°F | Resp 16 | Ht 59.0 in | Wt 105.0 lb

## 2016-03-04 DIAGNOSIS — M542 Cervicalgia: Secondary | ICD-10-CM

## 2016-03-04 DIAGNOSIS — Z026 Encounter for examination for insurance purposes: Secondary | ICD-10-CM | POA: Diagnosis not present

## 2016-03-04 MED ORDER — KETOROLAC TROMETHAMINE 60 MG/2ML IM SOLN
60.0000 mg | Freq: Once | INTRAMUSCULAR | Status: AC
  Administered 2016-03-04: 60 mg via INTRAMUSCULAR

## 2016-03-04 MED ORDER — IBUPROFEN 600 MG PO TABS: 600.0000 mg | ORAL_TABLET | Freq: Three times a day (TID) | ORAL | 0 refills | Status: AC | PRN

## 2016-03-04 MED ORDER — IBUPROFEN 600 MG PO TABS: 600.0000 mg | ORAL_TABLET | Freq: Three times a day (TID) | ORAL | 0 refills | Status: DC | PRN

## 2016-03-04 MED ORDER — TRAMADOL HCL 50 MG PO TABS: 25.0000 mg | ORAL_TABLET | Freq: Three times a day (TID) | ORAL | 0 refills | Status: AC | PRN

## 2016-03-04 MED ORDER — CYCLOBENZAPRINE HCL 10 MG PO TABS: 5.0000 mg | ORAL_TABLET | Freq: Three times a day (TID) | ORAL | 0 refills | Status: AC | PRN

## 2016-03-04 NOTE — Patient Instructions (Signed)
     IF you received an x-ray today, you will receive an invoice from [facility]. Please contact [facility] at [phone] with questions or concerns regarding your invoice.   IF you received labwork today, you will receive an invoice from [facility]. Please contact [facility] at [phone] with questions or concerns regarding your invoice.   Our billing staff will not be able to assist you with questions regarding bills from these companies.  You will be contacted with the lab results as soon as they are available. The fastest way to get your results is to activate your My Chart account. Instructions are located on the last page of this paperwork. If you have not heard from us regarding the results in 2 weeks, please contact this office.      

## 2016-03-04 NOTE — Progress Notes (Addendum)
Patient ID: [name], female   DOB: 08/25/1970, 45 y.o.   MRN: [id number]   By signing my name below, I, Essence Howell, attest that this documentation has been prepared under the direction and in the presence of Deliah BostonMichael Clark, PA-C Electronically Signed: Charline BillsEssence Howell, ED Scribe 03/04/2016 at 5:55 PM.  03/04/2016 6:41 PM   DOB: 04/18/1971 / MRN: [id number]  SUBJECTIVE:  [name] is a 45 y.o. female presenting for an injury that occurred at work PTA. She is complaining of neck pain. Pt works at [employer]. She states that she was leaning forward in the laundry room when a lot of blankets fell from above and landed on her neck. She reports increased pain with palpation and movement. Pt also reports numbness in her right arm. No treatments tried PTA. No possibility of pregnancy as she is status post hysterectomy.   She has No Known Allergies.   She  reports that she has never smoked. She has never used smokeless tobacco.  Review of Systems  Musculoskeletal: Positive for neck pain.   The problem list and medications were reviewed and updated by myself where necessary and exist elsewhere in the encounter.   OBJECTIVE:  BP 116/70 (BP Location: Right Arm, Patient Position: Sitting, Cuff Size: Small)    Pulse 81    Temp 98.5 F (36.9 C) (Oral)    Resp 16    Ht 4\' 11"  (1.499 m)    Wt 105 lb (47.6 kg)    SpO2 99%    BMI 21.21 kg/m   Physical Exam  Constitutional: She is oriented to person, place, and time. She appears well-developed and well-nourished. No distress.  Neck:  No stepoff deformities about the spine  Cardiovascular: Normal rate.   Pulmonary/Chest: Effort normal.  Neurological: She is alert and oriented to person, place, and time.  Reflexes are 2+ in UE bilaterally. Strength is equal bilaterally   Skin: Skin is warm and dry.  Psychiatric: She has a normal mood and affect.   No results found for this or any previous visit (from the past 72  hour(s)).  Dg Cervical Spine Complete  Result Date: 03/04/2016 CLINICAL DATA:  45 year old female with history of blank its proper on her head while standing under a laundry chute. Right-sided neck pain. EXAM: CERVICAL SPINE - COMPLETE 4+ VIEW COMPARISON:  None. FINDINGS: There is no evidence of cervical spine fracture or prevertebral soft tissue swelling. Alignment is normal. No other significant bone abnormalities are identified. IMPRESSION: Negative cervical spine radiographs. Electronically Signed   By: Trudie Reedaniel  Entrikin M.D.   On: 03/04/2016 18:31    ASSESSMENT AND PLAN   [name] was seen today for neck pain.  Diagnoses and all orders for this visit:  Neck pain: 2/2 to injury at work.  She will come back on Monday for re-eval and potential clearance for work.  -     ketorolac (TORADOL) injection 60 mg; Inject 2 mLs (60 mg total) into the muscle once. -     DG Cervical Spine Complete; Future  Encounter related to worker's compensation claim -     Discontinue: ibuprofen (ADVIL,MOTRIN) 600 MG tablet; Take 1 tablet (600 mg total) by mouth every 8 (eight) hours as needed. -     cyclobenzaprine (FLEXERIL) 10 MG tablet; Take 0.5-1 tablets (5-10 mg total) by mouth 3 (three) times daily as needed for muscle spasms (May cause drowsiness. Do no operate heavy machinery while taking.). -     traMADol (ULTRAM) 50 MG tablet;  Take 0.5-1 tablets (25-50 mg total) by mouth every 8 (eight) hours as needed. -     ibuprofen (ADVIL,MOTRIN) 600 MG tablet; Take 1 tablet (600 mg total) by mouth every 8 (eight) hours as needed.     The patient is advised to call or return to clinic if she does not see an improvement in symptoms, or to seek the care of the closest emergency department if she worsens with the above plan.   Deliah Boston, MHS, PA-C [facility] 03/04/2016 6:41 PM

## 2016-03-07 ENCOUNTER — Ambulatory Visit (INDEPENDENT_AMBULATORY_CARE_PROVIDER_SITE_OTHER): Payer: Managed Care, Other (non HMO) | Admitting: Physician Assistant

## 2016-03-07 VITALS — BP 110/60 | HR 86 | Temp 98.6°F | Resp 16 | Ht 59.0 in | Wt 104.6 lb

## 2016-03-07 DIAGNOSIS — M542 Cervicalgia: Secondary | ICD-10-CM | POA: Diagnosis not present

## 2016-03-07 DIAGNOSIS — Z026 Encounter for examination for insurance purposes: Secondary | ICD-10-CM

## 2016-03-07 NOTE — Progress Notes (Addendum)
By signing my name below, I, Raven Small, attest that this documentation has been prepared under the direction and in the presence of Deliah Boston, PA-C.  Electronically Signed: Andrew Au, ED Scribe. 03/07/2016. 5:43 PM.  03/07/2016 5:51 PM   DOB: 1970-08-21 / MRN: [id number]  SUBJECTIVE:  [name] is a 45 y.o. female presenting for worker comp follow up.  Previous HPI as per follows: "Injury that occurred at work PTA. She is complaining of neck pain. Pt works at [employer]. She states that she was leaning forward in the laundry room when a lot of blankets fell from above and landed on her neck. She reports increased pain with palpation and movement. Pt also reports numbness in her right arm. No treatments tried PTA. No possibility of pregnancy as she is status post hysterectomy."   Today, pt reports 50% improvement since last visit. She still notes some neck pain with rapid neck movement and some tingling down right arm. States treatment has helped with neck pain as well as sleep. She would like a couple more days out a of work until pain resolves. She denies numbness.   Review of Systems  Musculoskeletal: Positive for myalgias and neck pain.  Neurological: Positive for tingling. Negative for tremors, sensory change and focal weakness.   The problem list and medications were reviewed and updated by myself where necessary and exist elsewhere in the encounter.   OBJECTIVE:  BP 110/60 (BP Location: Right Arm, Patient Position: Sitting, Cuff Size: Small)   Pulse 86   Temp 98.6 F (37 C) (Oral)   Resp 16   Ht 4\' 11"  (1.499 m)   Wt 104 lb 9.6 oz (47.4 kg)   SpO2 100%   BMI 21.13 kg/m   Physical Exam  Constitutional: She is oriented to person, place, and time. Vital signs are normal. She appears well-developed.  HENT:  Mildly tender right upper trapezius. no bruising or swelling.   Cardiovascular: Normal rate.   Pulmonary/Chest: Effort normal. No respiratory  distress.  Musculoskeletal: Normal range of motion.  Neurological: She is alert and oriented to person, place, and time. She has normal strength. No sensory deficit.  Reflex Scores:      Tricep reflexes are 2+ on the right side and 2+ on the left side.      Bicep reflexes are 2+ on the right side and 2+ on the left side.      Brachioradialis reflexes are 2+ on the right side and 2+ on the left side. Normal and equal UE strength.   Skin: Skin is warm and dry.  Psychiatric: She has a normal mood and affect. Her behavior is normal. Judgment and thought content normal.   ASSESSMENT AND PLAN  [name] was seen today for follow-up.  Diagnoses and all orders for this visit:  Neck pain: She is 50% better today and feels that with a few more days rest she will be able to go back to work without restriction.  I had considered sindng her back with restricition however the nature of her work is physical and going back too soon could certainly exacerbate her injury and potentially create more missed time.  She will go back without restriction on Thursday.   Encounter related to worker's compensation claim: See above.    This note was scribed in my presence and I performed the services described in the this documentation.   The patient is advised to call or return to clinic if she does not see an improvement  in symptoms, or to seek the care of the closest emergency department if she worsens with the above plan.   Deliah BostonMichael Telma Pyeatt, MHS, PA-C [facility] 03/07/2016 5:42 PM

## 2016-03-07 NOTE — Patient Instructions (Signed)
     IF you received an x-ray today, you will receive an invoice from [facility]. Please contact [facility] at [phone] with questions or concerns regarding your invoice.   IF you received labwork today, you will receive an invoice from [facility]. Please contact [facility] at [phone] with questions or concerns regarding your invoice.   Our billing staff will not be able to assist you with questions regarding bills from these companies.  You will be contacted with the lab results as soon as they are available. The fastest way to get your results is to activate your My Chart account. Instructions are located on the last page of this paperwork. If you have not heard from us regarding the results in 2 weeks, please contact this office.      

## 2016-03-09 ENCOUNTER — Ambulatory Visit (INDEPENDENT_AMBULATORY_CARE_PROVIDER_SITE_OTHER): Payer: Managed Care, Other (non HMO) | Admitting: Family Medicine

## 2016-03-09 VITALS — BP 110/70 | HR 110 | Temp 98.0°F | Resp 17 | Ht 59.0 in | Wt 103.0 lb

## 2016-03-09 DIAGNOSIS — M5412 Radiculopathy, cervical region: Secondary | ICD-10-CM | POA: Diagnosis not present

## 2016-03-09 DIAGNOSIS — M542 Cervicalgia: Secondary | ICD-10-CM | POA: Diagnosis not present

## 2016-03-09 NOTE — Patient Instructions (Addendum)
  Great to meet you!  Continue the pain medications as before, you will be called for an appointment to see an orthopedic specialist.    IF you received an x-ray today, [facility]. Please contact [facility] at [phone] with questions or concerns regarding your invoice.   IF you received labwork today, you will receive an invoice from [facility]. Please contact [facility] at [phone] with questions or concerns regarding your invoice.   Our billing staff will not be able to assist you with questions regarding bills from these companies.  You will be contacted with the lab results as soon as they are available. The fastest way to get your results is to activate your My Chart account. Instructions are located on the last page of this paperwork. If you have not heard from us regarding the results in 2 weeks, please contact this office.

## 2016-03-09 NOTE — Progress Notes (Signed)
   HPI  Patient presents today with persistent neck pain.  Patient was injured at work on 03/04/2016 when a large amount of laundry was dropped while she was leaning forward landing on her posterior neck.  She has been slowly improving using NSAIDs and Flexeril. Yesterday she started doing very simple housework again and woke up this morning with arm weakness and pain radiating down from her right neck to her right palm.  She denies any additional injury.  The pain medications are helping the pain, however she is very concerned about the radiating pain and weakness.   PMH: Smoking status noted ROS: Per HPI  Objective: BP 110/70 (BP Location: Right Arm, Patient Position: Sitting, Cuff Size: Normal)   Pulse (!) 110   Temp 98 F (36.7 C) (Oral)   Resp 17   Ht 4\' 11"  (1.499 m)   Wt 103 lb (46.7 kg)   SpO2 98%   BMI 20.80 kg/m  Gen: NAD, alert, cooperative with exam HEENT: NCAT, EOMI, PERRL Ext: No edema, warm Neuro: Alert and oriented, strength 3/5 on the right grip and 4/5 on the flexion and extension of the right upper extremity compared to 5/5 grip, flexion, and extension on the left. Musculoskeletal Severe tenderness to palpation of cervical spine and right-sided paraspinal muscles along with tenderness to palpation over the supraspinatus  Assessment and plan:  # Acute right-sided neck pain, radiculopathy of the right sided cervical area Worsening With new weakness, primarily in grip, I have recommended referral to ortho urgently.  Avoided sterorids for now given seizure d/o Pain controlled.  RTC with any concerns Out of work until cleared by ortho.     Orders Placed This Encounter  Procedures  . Ambulatory referral to Orthopedic Surgery    Referral Priority:   Urgent    Referral Type:   Surgical    Referral Reason:   Specialty Services Required    Requested Specialty:   Orthopedic Surgery    Number of Visits Requested:   1     Murtis SinkSam Nolan Tuazon, MD 03/09/2016,  10:20 AM

## 2016-04-01 ENCOUNTER — Ambulatory Visit (INDEPENDENT_AMBULATORY_CARE_PROVIDER_SITE_OTHER): Payer: Worker's Compensation

## 2016-04-01 ENCOUNTER — Ambulatory Visit (INDEPENDENT_AMBULATORY_CARE_PROVIDER_SITE_OTHER): Payer: Managed Care, Other (non HMO) | Admitting: Orthopaedic Surgery

## 2016-04-01 ENCOUNTER — Encounter (INDEPENDENT_AMBULATORY_CARE_PROVIDER_SITE_OTHER): Payer: Self-pay | Admitting: Orthopaedic Surgery

## 2016-04-01 DIAGNOSIS — M25511 Pain in right shoulder: Secondary | ICD-10-CM | POA: Diagnosis not present

## 2016-04-01 MED ORDER — METHOCARBAMOL 500 MG PO TABS: 500.0000 mg | ORAL_TABLET | Freq: Four times a day (QID) | ORAL | 2 refills | Status: AC | PRN

## 2016-04-01 MED ORDER — PREDNISONE 10 MG (21) PO TBPK: 10.0000 mg | ORAL_TABLET | Freq: Every day | ORAL | 0 refills | Status: AC

## 2016-04-01 MED ORDER — DICLOFENAC SODIUM 75 MG PO TBEC: 75.0000 mg | DELAYED_RELEASE_TABLET | Freq: Two times a day (BID) | ORAL | 2 refills | Status: AC

## 2016-04-01 NOTE — Progress Notes (Signed)
Office Visit Note   Patient: [name]           Date of Birth: 05/20/1970           MRN: [id number] Visit Date: 04/01/2016              Requested by: Elenora GammaSamuel L Bradshaw, MD [address] [place], [place] [id number] PCP: No PCP Per Patient   Assessment & Plan: Visit Diagnoses:  1. Acute pain of right shoulder     Plan: I also reviewed the cervical spine x-rays which were negative. It's difficult to pinpoint what is actually going on. I think she has a lot of referred pain and muscular spasm issues. I will like to put her in physical therapy for 4 weeks with modalities and soft tissue mobilization. I prescribed Robaxin, Sterapred, diclofenac hopefully this will help her with her symptoms. I'll see her back in 4 weeks. If she is not better we'll consider MRI of the right shoulder plus or minus cervical spine.   The language barrier did increase the complexity of the visit today.  Follow-Up Instructions: Return in about 4 weeks (around 04/29/2016).   Orders:  Orders Placed This Encounter  Procedures  . XR Shoulder Right   Meds ordered this encounter  Medications  . predniSONE (STERAPRED UNI-PAK 21 TAB) 10 MG (21) TBPK tablet    Sig: Take 1 tablet (10 mg total) by mouth daily. Take as directed    Dispense:  21 tablet    Refill:  0  . methocarbamol (ROBAXIN) 500 MG tablet    Sig: Take 1 tablet (500 mg total) by mouth every 6 (six) hours as needed for muscle spasms.    Dispense:  30 tablet    Refill:  2  . diclofenac (VOLTAREN) 75 MG EC tablet    Sig: Take 1 tablet (75 mg total) by mouth 2 (two) times daily.    Dispense:  30 tablet    Refill:  2      Procedures: No procedures performed   Clinical Data: No additional findings.   Subjective: No chief complaint on file.   The patient is a healthy 45 year old female who sustained a work-related injury on 03/04/2016 in which a bag of laundry struck her head and neck. Since then she has had pain in the right side  of her neck that radiates down into the upper back and into the parascapular region and into the shoulder region. She denies any radicular symptoms. She is currently not working. He endorses tingling. She has been taking Flexeril which does make her extremely sleepy. She has not had any physical therapy. She denies any focal motor or sensory deficits. Denies any constitutional symptoms. Pain is worse with movement of the neck.    Review of Systems  Constitutional: Negative.   HENT: Negative.   Eyes: Negative.   Respiratory: Negative.   Cardiovascular: Negative.   Endocrine: Negative.   Musculoskeletal: Negative.   Neurological: Negative.   Hematological: Negative.   Psychiatric/Behavioral: Negative.   All other systems reviewed and are negative.    Objective: Vital Signs: There were no vitals taken for this visit.  Physical Exam  Constitutional: She is oriented to person, place, and time. She appears well-developed and well-nourished.  HENT:  Head: Atraumatic.  Eyes: EOM are normal.  Neck: Neck supple.  Cardiovascular: Intact distal pulses.   Pulmonary/Chest: Effort normal.  Abdominal: Soft.  Neurological: She is alert and oriented to person, place, and time.  Skin:  Skin is warm. Capillary refill takes less than 2 seconds.  Psychiatric: She has a normal mood and affect. Her behavior is normal. Judgment and thought content normal.  Nursing note and vitals reviewed.   Ortho Exam Exam of the cervical spine shows normal range of motion with a negative Spurling sign. Negative Lhermitte's. Range of motion of the shoulder does cause catching pain. Her before meals joint is tender. Her rhomboid musculature is also tender. Trapezial muscle is tender. She has no focal motor or sensory findings. Her reflexes are normal. No upper motor neuron signs. She has strong distal pulses. Specialty Comments:  No specialty comments available.  Imaging: Xr Shoulder Right  Result Date:  04/01/2016 Negative for acute findings.    PMFS History: There are no active problems to display for this patient.  Past Medical History:  Diagnosis Date  . Epilepsy (HCC)   . Seizures (HCC)     No family history on file.  Past Surgical History:  Procedure Laterality Date  . CESAREAN SECTION     Social History   Occupational History  . Not on file.   Social History Main Topics  . Smoking status: Never Smoker  . Smokeless tobacco: Never Used  . Alcohol use Not on file  . Drug use: Unknown  . Sexual activity: Not on file

## 2016-05-02 ENCOUNTER — Ambulatory Visit (INDEPENDENT_AMBULATORY_CARE_PROVIDER_SITE_OTHER): Payer: Self-pay | Admitting: Orthopaedic Surgery

## 2016-05-03 ENCOUNTER — Ambulatory Visit (INDEPENDENT_AMBULATORY_CARE_PROVIDER_SITE_OTHER): Payer: Managed Care, Other (non HMO) | Admitting: Orthopaedic Surgery

## 2016-05-03 ENCOUNTER — Encounter (INDEPENDENT_AMBULATORY_CARE_PROVIDER_SITE_OTHER): Payer: Self-pay | Admitting: Orthopaedic Surgery

## 2016-05-03 ENCOUNTER — Encounter (INDEPENDENT_AMBULATORY_CARE_PROVIDER_SITE_OTHER): Payer: Self-pay

## 2016-05-03 DIAGNOSIS — M25511 Pain in right shoulder: Secondary | ICD-10-CM | POA: Diagnosis not present

## 2016-05-03 NOTE — Progress Notes (Signed)
   Office Visit Note   Patient: [name]           Date of Birth: 12/18/1970           MRN: [id number] Visit Date: 05/03/2016              Requested by: No referring provider defined for this encounter. PCP: No PCP Per Patient   Assessment & Plan: Visit Diagnoses:  1. Acute pain of right shoulder     Plan: At this point patient continues to be symptomatic despite conservative treatment. We'll order MRI of right shoulder to rule out rotator cuff tear. Out of work no provided follow-up after the MRI.  Follow-Up Instructions: Return in about 2 weeks (around 05/17/2016).   Orders:  Orders Placed This Encounter  Procedures  . MR SHOULDER RIGHT WO CONTRAST   No orders of the defined types were placed in this encounter.     Procedures: No procedures performed   Clinical Data: No additional findings.   Subjective: Chief Complaint  Patient presents with  . Right Shoulder - Pain    Patient returns today for her shoulder pain follow-up areas she is doing slightly better and doing home exercises and also doing physical therapy. She still has some tingling and pain that radiates from the shoulder down to the elbow. Denies any radicular symptoms.    Review of Systems  All other systems reviewed and are negative.    Objective: Vital Signs: There were no vitals taken for this visit.  Physical Exam Well-developed well-nourished no acute distress alert 3 Ortho Exam Exam of the right shoulder shows positive empty can testing and positive impingement. Specialty Comments:  No specialty comments available.  Imaging: No results found.   PMFS History: There are no active problems to display for this patient.  Past Medical History:  Diagnosis Date  . Epilepsy (HCC)   . Seizures (HCC)     No family history on file.  Past Surgical History:  Procedure Laterality Date  . CESAREAN SECTION     Social History   Occupational History  . Not on file.    Social History Main Topics  . Smoking status: Never Smoker  . Smokeless tobacco: Never Used  . Alcohol use Not on file  . Drug use: Unknown  . Sexual activity: Not on file

## 2016-05-23 ENCOUNTER — Ambulatory Visit (INDEPENDENT_AMBULATORY_CARE_PROVIDER_SITE_OTHER): Payer: Self-pay | Admitting: Orthopaedic Surgery

## 2016-06-03 ENCOUNTER — Ambulatory Visit (INDEPENDENT_AMBULATORY_CARE_PROVIDER_SITE_OTHER): Payer: Self-pay | Admitting: Orthopaedic Surgery

## 2016-09-02 ENCOUNTER — Other Ambulatory Visit (INDEPENDENT_AMBULATORY_CARE_PROVIDER_SITE_OTHER): Payer: Self-pay | Admitting: Orthopaedic Surgery

## 2016-09-04 ENCOUNTER — Ambulatory Visit
Admission: RE | Admit: 2016-09-04 | Discharge: 2016-09-04 | Disposition: A | Discharge status: Home / Self Care | Payer: Managed Care, Other (non HMO) | Source: Ambulatory Visit | Attending: Orthopaedic Surgery | Admitting: Orthopaedic Surgery

## 2016-09-04 DIAGNOSIS — M25511 Pain in right shoulder: Secondary | ICD-10-CM

## 2018-03-18 ENCOUNTER — Inpatient Hospital Stay (HOSPITAL_COMMUNITY)
Admission: EM | Admit: 2018-03-18 | Discharge: 2018-03-20 | DRG: 179 | Disposition: A | Discharge status: Home / Self Care | Payer: Managed Care, Other (non HMO) | Attending: Internal Medicine | Admitting: Internal Medicine

## 2018-03-18 ENCOUNTER — Encounter (HOSPITAL_COMMUNITY): Payer: Self-pay | Admitting: *Deleted

## 2018-03-18 ENCOUNTER — Other Ambulatory Visit: Payer: Self-pay

## 2018-03-18 DIAGNOSIS — F312 Bipolar disorder, current episode manic severe with psychotic features: Secondary | ICD-10-CM

## 2018-03-18 DIAGNOSIS — E876 Hypokalemia: Secondary | ICD-10-CM

## 2018-03-18 DIAGNOSIS — Z79899 Other long term (current) drug therapy: Secondary | ICD-10-CM

## 2018-03-18 DIAGNOSIS — D649 Anemia, unspecified: Secondary | ICD-10-CM | POA: Diagnosis present

## 2018-03-18 DIAGNOSIS — E538 Deficiency of other specified B group vitamins: Secondary | ICD-10-CM | POA: Diagnosis present

## 2018-03-18 DIAGNOSIS — R569 Unspecified convulsions: Secondary | ICD-10-CM

## 2018-03-18 DIAGNOSIS — R6510 Systemic inflammatory response syndrome (SIRS) of non-infectious origin without acute organ dysfunction: Secondary | ICD-10-CM

## 2018-03-18 DIAGNOSIS — R509 Fever, unspecified: Secondary | ICD-10-CM

## 2018-03-18 DIAGNOSIS — E611 Iron deficiency: Secondary | ICD-10-CM | POA: Diagnosis present

## 2018-03-18 DIAGNOSIS — F29 Unspecified psychosis not due to a substance or known physiological condition: Secondary | ICD-10-CM | POA: Diagnosis present

## 2018-03-18 DIAGNOSIS — J189 Pneumonia, unspecified organism: Secondary | ICD-10-CM

## 2018-03-18 DIAGNOSIS — G40909 Epilepsy, unspecified, not intractable, without status epilepticus: Secondary | ICD-10-CM

## 2018-03-18 DIAGNOSIS — J690 Pneumonitis due to inhalation of food and vomit: Principal | ICD-10-CM | POA: Diagnosis present

## 2018-03-18 DIAGNOSIS — Z9114 Patient's other noncompliance with medication regimen: Secondary | ICD-10-CM

## 2018-03-18 HISTORY — DX: Unspecified convulsions: R56.9

## 2018-03-18 HISTORY — DX: Epilepsy, unspecified, not intractable, without status epilepticus: G40.909

## 2018-03-18 LAB — COMPREHENSIVE METABOLIC PANEL
ALT: 18 U/L (ref 0–44)
ANION GAP: 10 (ref 5–15)
AST: 24 U/L (ref 15–41)
Albumin: 4.3 g/dL (ref 3.5–5.0)
Alkaline Phosphatase: 94 U/L (ref 38–126)
BUN: 10 mg/dL (ref 6–20)
CHLORIDE: 103 mmol/L (ref 98–111)
CO2: 26 mmol/L (ref 22–32)
Calcium: 9.2 mg/dL (ref 8.9–10.3)
Creatinine, Ser: 0.64 mg/dL (ref 0.44–1.00)
Glucose, Bld: 113 mg/dL — ABNORMAL HIGH (ref 70–99)
POTASSIUM: 3.6 mmol/L (ref 3.5–5.1)
Sodium: 139 mmol/L (ref 135–145)
Total Bilirubin: 0.6 mg/dL (ref 0.3–1.2)
Total Protein: 8 g/dL (ref 6.5–8.1)

## 2018-03-18 LAB — RAPID URINE DRUG SCREEN, HOSP PERFORMED
Amphetamines: NOT DETECTED
Barbiturates: NOT DETECTED
Benzodiazepines: NOT DETECTED
COCAINE: NOT DETECTED
Opiates: NOT DETECTED
Tetrahydrocannabinol: NOT DETECTED

## 2018-03-18 LAB — CBC
HCT: 36 % (ref 36.0–46.0)
Hemoglobin: 11.1 g/dL — ABNORMAL LOW (ref 12.0–15.0)
MCH: 25.1 pg — ABNORMAL LOW (ref 26.0–34.0)
MCHC: 30.8 g/dL (ref 30.0–36.0)
MCV: 81.4 fL (ref 80.0–100.0)
PLATELETS: 310 10*3/uL (ref 150–400)
RBC: 4.42 MIL/uL (ref 3.87–5.11)
RDW: 16.2 % — AB (ref 11.5–15.5)
WBC: 14.7 10*3/uL — AB (ref 4.0–10.5)
nRBC: 0 % (ref 0.0–0.2)

## 2018-03-18 LAB — SALICYLATE LEVEL

## 2018-03-18 LAB — ETHANOL

## 2018-03-18 LAB — ACETAMINOPHEN LEVEL

## 2018-03-18 LAB — HCG, QUANTITATIVE, PREGNANCY: hCG, Beta Chain, Quant, S: <1 m[IU]/mL (ref <5)

## 2018-03-18 MED ORDER — ASENAPINE MALEATE 5 MG SL SUBL: 10.0000 mg | SUBLINGUAL_TABLET | Freq: Two times a day (BID) | SUBLINGUAL | Status: DC

## 2018-03-18 MED ORDER — LORAZEPAM 2 MG/ML IJ SOLN
2.0000 mg | Freq: Once | INTRAMUSCULAR | Status: AC
  Administered 2018-03-18: 2 mg via INTRAMUSCULAR
  Filled 2018-03-18: qty 1

## 2018-03-18 MED ORDER — STERILE WATER FOR INJECTION IJ SOLN
INTRAMUSCULAR | Status: AC
  Filled 2018-03-18: qty 10

## 2018-03-18 MED ORDER — ASENAPINE MALEATE 5 MG SL SUBL
10.0000 mg | SUBLINGUAL_TABLET | Freq: Two times a day (BID) | SUBLINGUAL | Status: DC
  Administered 2018-03-19: 10 mg via SUBLINGUAL
  Filled 2018-03-18: qty 2

## 2018-03-18 MED ORDER — ZIPRASIDONE MESYLATE 20 MG IM SOLR
10.0000 mg | Freq: Once | INTRAMUSCULAR | Status: AC
  Administered 2018-03-18: 10 mg via INTRAMUSCULAR
  Filled 2018-03-18: qty 20

## 2018-03-18 MED ORDER — ZIPRASIDONE MESYLATE 20 MG IM SOLR: 10.0000 mg | Freq: Once | INTRAMUSCULAR | Status: DC

## 2018-03-18 MED ORDER — DIPHENHYDRAMINE HCL 50 MG/ML IJ SOLN
50.0000 mg | Freq: Once | INTRAMUSCULAR | Status: AC
  Administered 2018-03-18: 50 mg via INTRAMUSCULAR
  Filled 2018-03-18: qty 1

## 2018-03-18 NOTE — ED Provider Notes (Signed)
[facility]-EMERGENCY DEPT Provider Note   CSN: [id number] Arrival date & time: 03/18/18  1316     History   Chief Complaint Chief Complaint  Patient presents with  . Suicidal  . Hallucinations    HPI [name] is a 47 y.o. female.  Patient presents with gpd, w report ivc'd by roommate. Pt will not provide any additional information - with interpreter just repeats 'I can't say' to all questions asked - level 5 caveat. Pt is not to dance around room and halls, appears to be responding to internal stimuli.   The history is provided by the patient and the police. The history is limited by the condition of the patient. A language interpreter was used.    History reviewed. No pertinent past medical history.  There are no active problems to display for this patient.   History reviewed. No pertinent surgical history.   OB History   None      Home Medications    Prior to Admission medications   Not on File    Family History No family history on file.  Social History Social History   Tobacco Use  . Smoking status: Unknown If Ever Smoked  Substance Use Topics  . Alcohol use: Not on file    Comment: unsure  . Drug use: Not on file    Comment: unsure     Allergies   Patient has no known allergies.   Review of Systems Review of Systems  Unable to perform ROS: Psychiatric disorder  level 5 caveat.     Physical Exam Updated Vital Signs BP (!) 141/95 (BP Location: Right Arm)   Pulse (!) 109   Temp 97.9 F (36.6 C) (Oral)   Resp (!) 21   Ht 1.422 m (4\' 8" )   Wt 44.5 kg   SpO2 100%   BMI 21.97 kg/m   Physical Exam  Constitutional: She appears well-developed and well-nourished.  HENT:  Head: Atraumatic.  Mouth/Throat: Oropharynx is clear and moist.  Eyes: Pupils are equal, round, and reactive to light. Conjunctivae are normal. No scleral icterus.  Neck: Neck supple. No tracheal deviation present.  No stiffness or  rigidity. Pt moves neck freely in all directions.  Cardiovascular: Normal rate, regular rhythm, normal heart sounds and intact distal pulses. Exam reveals no gallop and no friction rub.  No murmur heard. Pulmonary/Chest: Effort normal and breath sounds normal. No respiratory distress.  Abdominal: Soft. Normal appearance and bowel sounds are normal. She exhibits no distension. There is no tenderness.  Musculoskeletal: She exhibits no edema or tenderness.  Neurological: She is alert.  Awake and alert. Dancing about ED making gyrating movements bil arms, chanting 'no puedo se' over and over.   Skin: Skin is warm and dry. No rash noted.  Psychiatric:  Pt appears to have elevated mood/?mania. Pt dancing about ED, repeating same phrases outloud. Appears to respond to internal stimuli, ?voices. Not cooperative w history.   Nursing note and vitals reviewed.    ED Treatments / Results  Labs (all labs ordered are listed, but only abnormal results are displayed) Results for orders placed or performed during the hospital encounter of 03/18/18  Comprehensive metabolic panel  Result Value Ref Range   Sodium 139 135 - 145 mmol/L   Potassium 3.6 3.5 - 5.1 mmol/L   Chloride 103 98 - 111 mmol/L   CO2 26 22 - 32 mmol/L   Glucose, Bld 113 (H) 70 - 99 mg/dL   BUN 10  6 - 20 mg/dL   Creatinine, Ser 4.09 0.44 - 1.00 mg/dL   Calcium 9.2 8.9 - 81.1 mg/dL   Total Protein 8.0 6.5 - 8.1 g/dL   Albumin 4.3 3.5 - 5.0 g/dL   AST 24 15 - 41 U/L   ALT 18 0 - 44 U/L   Alkaline Phosphatase 94 38 - 126 U/L   Total Bilirubin 0.6 0.3 - 1.2 mg/dL   GFR calc non Af Amer >60 >60 mL/min   GFR calc Af Amer >60 >60 mL/min   Anion gap 10 5 - 15  cbc  Result Value Ref Range   WBC PENDING 4.0 - 10.5 K/uL   RBC 4.42 3.87 - 5.11 MIL/uL   Hemoglobin 11.1 (L) 12.0 - 15.0 g/dL   HCT 91.4 78.2 - 95.6 %   MCV 81.4 80.0 - 100.0 fL   MCH 25.1 (L) 26.0 - 34.0 pg   MCHC 30.8 30.0 - 36.0 g/dL   RDW 21.3 (H) 08.6 - 57.8 %    Platelets 310 150 - 400 K/uL   nRBC PENDING 0.0 - 0.2 %    EKG None  Radiology No results found.  Procedures Procedures (including critical care time)  Medications Ordered in ED Medications  ziprasidone (GEODON) injection 10 mg (has no administration in time range)     Initial Impression / Assessment and Plan / ED Course  I have reviewed the triage vital signs and the nursing notes.  Pertinent labs & imaging results that were available during my care of the patient were reviewed by me and considered in my medical decision making (see chart for details).  Labs sent.   Pt w agitation, uncooperative, trying to move about, dance about ED.  Geodon 10 mg im for symptom improvement.   Labs reviewed - chem normal.   BH team consulted.   Recheck pt calmer/alert, content.   Disposition per [facility] team, and oncoming providers.     Final Clinical Impressions(s) / ED Diagnoses   Final diagnoses:  None    ED Discharge Orders    None       Cathren Laine, MD 03/18/18 1459

## 2018-03-18 NOTE — ED Notes (Signed)
Patient has been wanded by security. Patient is IVC'ed by [employer].

## 2018-03-18 NOTE — Progress Notes (Signed)
03/18/18  1420  Labs drawn on patient and sent to lab. Gold, purple, light blue, light green, and dark green sent to lab.

## 2018-03-18 NOTE — Progress Notes (Signed)
Patient brought in with IVC under another name. IVC invalid, CSW made EDP aware.   New IVC to be initiated.   Enid Cutter, LCSW-A Clinical Social Worker [phone]

## 2018-03-18 NOTE — Progress Notes (Signed)
03/18/18  1700  MD notified of patient seizure. MD in room to see patient.

## 2018-03-18 NOTE — ED Notes (Signed)
Dr Denton Lank and writer attempted to assess pt with "[facility]" interpreter. Pt would not cooperative with questions.

## 2018-03-18 NOTE — ED Notes (Signed)
Pt husband left contact number  Paulita Fujita [phone]

## 2018-03-18 NOTE — ED Notes (Signed)
Pt is still manic and restless.  Husband is at bedside translating as she would not cooperate using the [place].  She asked for water but would not swallow it once it was in her mouth.  Patient spit water toward husband.  Husband states that patient acts like this when she is off her meds and needs to sleep.
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
## 2018-03-18 NOTE — ED Notes (Addendum)
Seizure activity noted lasting about 30 seconds.  Pt was shaking and making strange mouth movements.  Pt was very confused immediately afterward.  Temp 98.2, HR 118, BP 122/71, Resp 22

## 2018-03-18 NOTE — ED Triage Notes (Signed)
Pt brought in with IVC papers, Manic with hallucinations, anxious and talking to imaginary people.

## 2018-03-18 NOTE — ED Provider Notes (Signed)
Called the patient's room by the nurse after the patient had a possible seizure.  Her husband was at her bedside and said she shook for a few seconds and was making some movements with her mouth.  He states the patient has had seizures since she was 16 and he does not think she is taking her Tegretol.  He said they had a long admission at [facility] after his seizure and they did not find anything.  I do not see these records in the patient's chart.  She received some Ativan along with Geodon for her agitation and is seems to be resting more comfortably.  We will continue to observe.   Terrilee Files, MD 03/19/18 1351

## 2018-03-19 ENCOUNTER — Emergency Department (HOSPITAL_COMMUNITY): Payer: Self-pay

## 2018-03-19 ENCOUNTER — Inpatient Hospital Stay (HOSPITAL_COMMUNITY): Admission: AD | Admit: 2018-03-19 | Payer: Federal, State, Local not specified - Other | Admitting: Psychiatry

## 2018-03-19 ENCOUNTER — Encounter (HOSPITAL_COMMUNITY): Payer: Self-pay | Admitting: Internal Medicine

## 2018-03-19 DIAGNOSIS — D649 Anemia, unspecified: Secondary | ICD-10-CM

## 2018-03-19 DIAGNOSIS — R509 Fever, unspecified: Secondary | ICD-10-CM

## 2018-03-19 DIAGNOSIS — E876 Hypokalemia: Secondary | ICD-10-CM

## 2018-03-19 DIAGNOSIS — R6510 Systemic inflammatory response syndrome (SIRS) of non-infectious origin without acute organ dysfunction: Secondary | ICD-10-CM

## 2018-03-19 DIAGNOSIS — G40909 Epilepsy, unspecified, not intractable, without status epilepticus: Secondary | ICD-10-CM

## 2018-03-19 DIAGNOSIS — F312 Bipolar disorder, current episode manic severe with psychotic features: Secondary | ICD-10-CM

## 2018-03-19 LAB — CBC
HCT: 30.3 % — ABNORMAL LOW (ref 36.0–46.0)
HEMOGLOBIN: 9.2 g/dL — AB (ref 12.0–15.0)
MCH: 25.1 pg — AB (ref 26.0–34.0)
MCHC: 30.4 g/dL (ref 30.0–36.0)
MCV: 82.8 fL (ref 80.0–100.0)
NRBC: 0 % (ref 0.0–0.2)
PLATELETS: 223 10*3/uL (ref 150–400)
RBC: 3.66 MIL/uL — AB (ref 3.87–5.11)
RDW: 16.6 % — ABNORMAL HIGH (ref 11.5–15.5)
WBC: 18.1 10*3/uL — AB (ref 4.0–10.5)

## 2018-03-19 LAB — CK
CK TOTAL: 70 U/L (ref 38–234)
Total CK: 93 U/L (ref 38–234)

## 2018-03-19 LAB — COMPREHENSIVE METABOLIC PANEL
ALBUMIN: 4 g/dL (ref 3.5–5.0)
ALK PHOS: 93 U/L (ref 38–126)
ALT: 16 U/L (ref 0–44)
AST: 18 U/L (ref 15–41)
Anion gap: 10 (ref 5–15)
BUN: 15 mg/dL (ref 6–20)
CALCIUM: 9.1 mg/dL (ref 8.9–10.3)
CO2: 25 mmol/L (ref 22–32)
CREATININE: 0.75 mg/dL (ref 0.44–1.00)
Chloride: 102 mmol/L (ref 98–111)
GFR calc Af Amer: >60 mL/min (ref >60)
GFR calc non Af Amer: >60 mL/min (ref >60)
GLUCOSE: 125 mg/dL — AB (ref 70–99)
Potassium: 3 mmol/L — ABNORMAL LOW (ref 3.5–5.1)
Sodium: 137 mmol/L (ref 135–145)
Total Bilirubin: 0.8 mg/dL (ref 0.3–1.2)
Total Protein: 7.8 g/dL (ref 6.5–8.1)

## 2018-03-19 LAB — CBC WITH DIFFERENTIAL/PLATELET
Abs Immature Granulocytes: 0.23 10*3/uL — ABNORMAL HIGH (ref 0.00–0.07)
BASOS ABS: 0 10*3/uL (ref 0.0–0.1)
BASOS PCT: 0 %
EOS ABS: 0 10*3/uL (ref 0.0–0.5)
EOS PCT: 0 %
HCT: 36.3 % (ref 36.0–46.0)
Hemoglobin: 11.1 g/dL — ABNORMAL LOW (ref 12.0–15.0)
Immature Granulocytes: 1 %
Lymphocytes Relative: 4 %
Lymphs Abs: 0.9 10*3/uL (ref 0.7–4.0)
MCH: 25 pg — ABNORMAL LOW (ref 26.0–34.0)
MCHC: 30.6 g/dL (ref 30.0–36.0)
MCV: 81.8 fL (ref 80.0–100.0)
MONO ABS: 1.5 10*3/uL — AB (ref 0.1–1.0)
Monocytes Relative: 7 %
NRBC: 0 % (ref 0.0–0.2)
Neutro Abs: 17.9 10*3/uL — ABNORMAL HIGH (ref 1.7–7.7)
Neutrophils Relative %: 88 %
Platelets: 258 10*3/uL (ref 150–400)
RBC: 4.44 MIL/uL (ref 3.87–5.11)
RDW: 16.4 % — AB (ref 11.5–15.5)
WBC: 20.5 10*3/uL — AB (ref 4.0–10.5)

## 2018-03-19 LAB — URINALYSIS, ROUTINE W REFLEX MICROSCOPIC
Bilirubin Urine: NEGATIVE
GLUCOSE, UA: NEGATIVE mg/dL
Hgb urine dipstick: NEGATIVE
Ketones, ur: 5 mg/dL — AB
Leukocytes, UA: NEGATIVE
Nitrite: POSITIVE — AB
PROTEIN: NEGATIVE mg/dL
Specific Gravity, Urine: 1.008 (ref 1.005–1.030)
pH: 7 (ref 5.0–8.0)

## 2018-03-19 LAB — CREATININE, SERUM: CREATININE: 0.58 mg/dL (ref 0.44–1.00)

## 2018-03-19 LAB — RETICULOCYTES
IMMATURE RETIC FRACT: 10.6 % (ref 2.3–15.9)
RBC.: 3.66 MIL/uL — ABNORMAL LOW (ref 3.87–5.11)
Retic Count, Absolute: 42.1 10*3/uL (ref 19.0–186.0)
Retic Ct Pct: 1.2 % (ref 0.4–3.1)

## 2018-03-19 LAB — I-STAT BETA HCG BLOOD, ED (MC, WL, AP ONLY)

## 2018-03-19 LAB — IRON AND TIBC
IRON: 11 ug/dL — AB (ref 28–170)
Saturation Ratios: 4 % — ABNORMAL LOW (ref 10.4–31.8)
TIBC: 300 ug/dL (ref 250–450)
UIBC: 289 ug/dL

## 2018-03-19 LAB — FERRITIN: Ferritin: 12 ng/mL (ref 11–307)

## 2018-03-19 LAB — INFLUENZA PANEL BY PCR (TYPE A & B)
Influenza A By PCR: NEGATIVE
Influenza B By PCR: NEGATIVE

## 2018-03-19 LAB — I-STAT CG4 LACTIC ACID, ED: Lactic Acid, Venous: 1.23 mmol/L (ref 0.5–1.9)

## 2018-03-19 MED ORDER — CARBAMAZEPINE 200 MG PO TABS
100.0000 mg | ORAL_TABLET | Freq: Two times a day (BID) | ORAL | Status: DC
  Administered 2018-03-19 – 2018-03-20 (×3): 100 mg via ORAL
  Filled 2018-03-19 (×5): qty 0.5

## 2018-03-19 MED ORDER — ACETAMINOPHEN 325 MG PO TABS
650.0000 mg | ORAL_TABLET | Freq: Once | ORAL | Status: DC
  Filled 2018-03-19: qty 2

## 2018-03-19 MED ORDER — VALPROATE SODIUM 500 MG/5ML IV SOLN
250.0000 mg | Freq: Two times a day (BID) | INTRAVENOUS | Status: DC
  Administered 2018-03-19: 250 mg via INTRAVENOUS
  Filled 2018-03-19 (×4): qty 2.5

## 2018-03-19 MED ORDER — POTASSIUM CHLORIDE IN NACL 40-0.9 MEQ/L-% IV SOLN
INTRAVENOUS | Status: DC
  Administered 2018-03-19 – 2018-03-20 (×2): 100 mL/h via INTRAVENOUS
  Filled 2018-03-19 (×2): qty 1000

## 2018-03-19 MED ORDER — ONDANSETRON HCL 4 MG PO TABS: 4.0000 mg | ORAL_TABLET | Freq: Four times a day (QID) | ORAL | Status: DC | PRN

## 2018-03-19 MED ORDER — ACETAMINOPHEN 325 MG PO TABS: 650.0000 mg | ORAL_TABLET | Freq: Four times a day (QID) | ORAL | Status: DC | PRN

## 2018-03-19 MED ORDER — SODIUM CHLORIDE 0.9 % IV SOLN: INTRAVENOUS | Status: DC

## 2018-03-19 MED ORDER — SODIUM CHLORIDE 0.9 % IV BOLUS
2000.0000 mL | Freq: Once | INTRAVENOUS | Status: AC
  Administered 2018-03-19: 2000 mL via INTRAVENOUS

## 2018-03-19 MED ORDER — SODIUM CHLORIDE 0.9 % IV SOLN
500.0000 mg | INTRAVENOUS | Status: DC
  Administered 2018-03-19: 500 mg via INTRAVENOUS
  Filled 2018-03-19 (×2): qty 500

## 2018-03-19 MED ORDER — ACETAMINOPHEN 650 MG RE SUPP
650.0000 mg | Freq: Once | RECTAL | Status: AC
  Administered 2018-03-19: 650 mg via RECTAL
  Filled 2018-03-19: qty 1

## 2018-03-19 MED ORDER — ACETAMINOPHEN 650 MG RE SUPP: 650.0000 mg | Freq: Four times a day (QID) | RECTAL | Status: DC | PRN

## 2018-03-19 MED ORDER — KETOROLAC TROMETHAMINE 30 MG/ML IJ SOLN: 30.0000 mg | Freq: Four times a day (QID) | INTRAMUSCULAR | Status: DC | PRN

## 2018-03-19 MED ORDER — IBUPROFEN 200 MG PO TABS: 400.0000 mg | ORAL_TABLET | Freq: Four times a day (QID) | ORAL | Status: DC | PRN

## 2018-03-19 MED ORDER — SODIUM CHLORIDE 0.9 % IV SOLN
2.0000 g | INTRAVENOUS | Status: DC
  Administered 2018-03-19: 2 g via INTRAVENOUS
  Filled 2018-03-19: qty 20

## 2018-03-19 MED ORDER — SODIUM CHLORIDE 0.9 % IV SOLN
3.0000 g | Freq: Four times a day (QID) | INTRAVENOUS | Status: DC
  Administered 2018-03-19 – 2018-03-20 (×3): 3 g via INTRAVENOUS
  Filled 2018-03-19 (×5): qty 3

## 2018-03-19 MED ORDER — ONDANSETRON HCL 4 MG/2ML IJ SOLN: 4.0000 mg | Freq: Four times a day (QID) | INTRAMUSCULAR | Status: DC | PRN

## 2018-03-19 MED ORDER — ENOXAPARIN SODIUM 30 MG/0.3ML ~~LOC~~ SOLN
30.0000 mg | SUBCUTANEOUS | Status: DC
  Administered 2018-03-19: 30 mg via SUBCUTANEOUS
  Filled 2018-03-19: qty 0.3

## 2018-03-19 NOTE — ED Notes (Signed)
Visitor at bedside.

## 2018-03-19 NOTE — ED Notes (Signed)
Pt to room #41, limited assessment d/t pt is currently sleeping. No s/s of distress noted, breathing non labored, respirations equal. Special checks q 15 mins in place for safety, Video monitoring in place. Will continue to monitor.

## 2018-03-19 NOTE — ED Notes (Signed)
Bed: [facility] Expected date:  Expected time:  Means of arrival:  Comments: Rm 29

## 2018-03-19 NOTE — ED Notes (Signed)
Received report from Vivi Ferns, RN from [place] and received verbal orders from Dr. Cordelia Poche.

## 2018-03-19 NOTE — ED Notes (Signed)
Bed: [facility] Expected date:  Expected time:  Means of arrival:  Comments: 55

## 2018-03-19 NOTE — ED Provider Notes (Signed)
Called to see patient secondary to temperature of 101.2.  According to the patient's husband, patient has had problems in the past with antipsychotics.  Patient did receive Saphris today.  She has been altered but will respond to voice.  She has not had any leadpipe rigidity.  Does have some tachycardia.  Which related to the fever.  No recent cough or urinary symptoms.  Septic work-up initiated as well as work-up for possible NMS.  On exam here patient has no rigidity in her extremities.  She has no apparent nuchal rigidity.  Work-up is pending at this time   Lorre Nick, MD 03/19/18 1511

## 2018-03-19 NOTE — ED Notes (Signed)
Pt transported via WC to room #20. Personal property placed in cabinet labeled 18 -22.

## 2018-03-19 NOTE — ED Notes (Signed)
Phlebotomist called for ordered lab drawl.  

## 2018-03-19 NOTE — Progress Notes (Signed)
   03/19/18 1839  MEWS Score  Resp 16  Pulse Rate 84  BP 93/67  Temp 98.8 F (37.1 C)  MEWS RR 0  MEWS Pulse 0  MEWS Systolic 1  MEWS LOC 2  MEWS Temp 0  MEWS Score 3  MEWS Score Color Yellow    Patient is not alert; only responds to pain. Will recheck vital signs in one hour.
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
## 2018-03-19 NOTE — BH Assessment (Signed)
Bed at [facility] has been rescinded as patient is not medically cleared, as previously indicated in the chart. [facility] made aware, reports pt to move to medical floor.

## 2018-03-19 NOTE — ED Notes (Signed)
This nurse and psychiatry team at bedside for morning rounds. [facility] interpretor # [id number] used for assessment. Pt denies SI. Will continue to monitor.

## 2018-03-19 NOTE — ED Notes (Signed)
Belongings locked up in locker # 41. Belongings itemized on belongings sheet and in patient belongings section of chart.

## 2018-03-19 NOTE — ED Notes (Signed)
Patient continues to be drowsy but is more alert and responsive. Pt assisted to use bed pan to urinate.

## 2018-03-19 NOTE — BH Assessment (Signed)
[facility] Assessment Progress Note  Per Juanetta Beets, DO, this pt requires psychiatric hospitalization.  Malva Limes, RN, [facility] has assigned pt to [facility] Rm [id number]; [facility] will be ready to receive pt at 16:00.  Pt presents under IVC initiated by a friend of the pt, however, owing to a misspelling of the pt's name, EDP Cathren Laine, MD initiated a new IVC.  All IVC documents have been faxed to [facility].  Pt's nurse, Morrie Sheldon, has been notified, and agrees to call report to [phone].  Pt is to be transported via [employer].   Doylene Canning, [place] [facility] Coordinator [phone]

## 2018-03-19 NOTE — ED Notes (Signed)
Interpreter used (placed at head of bed) while performing in&out cath for urine sample as well as giving tylenol suppository. Procedure explained to pt. Pt continued to be drowsy throughout procedure but tolerated well and was able to respond by lifting hips when asked.

## 2018-03-20 ENCOUNTER — Inpatient Hospital Stay (HOSPITAL_COMMUNITY): Payer: Self-pay

## 2018-03-20 DIAGNOSIS — D509 Iron deficiency anemia, unspecified: Secondary | ICD-10-CM

## 2018-03-20 DIAGNOSIS — E611 Iron deficiency: Secondary | ICD-10-CM

## 2018-03-20 DIAGNOSIS — R569 Unspecified convulsions: Secondary | ICD-10-CM

## 2018-03-20 DIAGNOSIS — G40909 Epilepsy, unspecified, not intractable, without status epilepticus: Secondary | ICD-10-CM

## 2018-03-20 DIAGNOSIS — E538 Deficiency of other specified B group vitamins: Secondary | ICD-10-CM

## 2018-03-20 DIAGNOSIS — E876 Hypokalemia: Secondary | ICD-10-CM

## 2018-03-20 DIAGNOSIS — R6510 Systemic inflammatory response syndrome (SIRS) of non-infectious origin without acute organ dysfunction: Secondary | ICD-10-CM

## 2018-03-20 LAB — COMPREHENSIVE METABOLIC PANEL
ALBUMIN: 2.9 g/dL — AB (ref 3.5–5.0)
ALK PHOS: 66 U/L (ref 38–126)
ALT: 11 U/L (ref 0–44)
ANION GAP: 5 (ref 5–15)
AST: 11 U/L — ABNORMAL LOW (ref 15–41)
BILIRUBIN TOTAL: 0.6 mg/dL (ref 0.3–1.2)
BUN: 8 mg/dL (ref 6–20)
CALCIUM: 8.1 mg/dL — AB (ref 8.9–10.3)
CO2: 24 mmol/L (ref 22–32)
Chloride: 111 mmol/L (ref 98–111)
Creatinine, Ser: 0.57 mg/dL (ref 0.44–1.00)
GFR calc Af Amer: >60 mL/min (ref >60)
GLUCOSE: 93 mg/dL (ref 70–99)
Potassium: 3.9 mmol/L (ref 3.5–5.1)
Sodium: 140 mmol/L (ref 135–145)
TOTAL PROTEIN: 5.7 g/dL — AB (ref 6.5–8.1)

## 2018-03-20 LAB — FOLATE: Folate: 4.7 ng/mL — ABNORMAL LOW (ref >5.9)

## 2018-03-20 LAB — MAGNESIUM: Magnesium: 1.9 mg/dL (ref 1.7–2.4)

## 2018-03-20 LAB — HIV ANTIBODY (ROUTINE TESTING W REFLEX): HIV Screen 4th Generation wRfx: NONREACTIVE

## 2018-03-20 LAB — VITAMIN B12: VITAMIN B 12: 172 pg/mL — AB (ref 180–914)

## 2018-03-20 MED ORDER — FERROUS SULFATE 325 (65 FE) MG PO TBEC: 325.0000 mg | DELAYED_RELEASE_TABLET | Freq: Two times a day (BID) | ORAL | 3 refills | Status: AC

## 2018-03-20 MED ORDER — SODIUM CHLORIDE 0.9 % IV SOLN
510.0000 mg | Freq: Once | INTRAVENOUS | Status: AC
  Administered 2018-03-20: 510 mg via INTRAVENOUS
  Filled 2018-03-20: qty 17

## 2018-03-20 MED ORDER — VALPROIC ACID 250 MG PO CAPS
250.0000 mg | ORAL_CAPSULE | Freq: Two times a day (BID) | ORAL | Status: DC
  Administered 2018-03-20: 250 mg via ORAL
  Filled 2018-03-20 (×2): qty 1

## 2018-03-20 MED ORDER — IBUPROFEN 400 MG PO TABS: 400.0000 mg | ORAL_TABLET | Freq: Four times a day (QID) | ORAL | 0 refills | Status: AC | PRN

## 2018-03-20 MED ORDER — CYANOCOBALAMIN 1000 MCG/ML IJ SOLN
1000.0000 ug | Freq: Every day | INTRAMUSCULAR | Status: DC
  Administered 2018-03-20: 1000 ug via SUBCUTANEOUS
  Filled 2018-03-20: qty 1

## 2018-03-20 MED ORDER — VITAMIN B-12 1000 MCG PO TABS: 1000.0000 ug | ORAL_TABLET | Freq: Every day | ORAL | 0 refills | Status: AC

## 2018-03-20 MED ORDER — FOLIC ACID 1 MG PO TABS: 1.0000 mg | ORAL_TABLET | Freq: Every day | ORAL | 3 refills | Status: AC

## 2018-03-20 MED ORDER — FOLIC ACID 5 MG/ML IJ SOLN
1.0000 mg | Freq: Every day | INTRAMUSCULAR | Status: DC
  Administered 2018-03-20: 1 mg via INTRAVENOUS
  Filled 2018-03-20: qty 0.2

## 2018-03-20 MED ORDER — ACETAMINOPHEN 325 MG PO TABS: 650.0000 mg | ORAL_TABLET | Freq: Four times a day (QID) | ORAL | Status: AC | PRN

## 2018-03-20 NOTE — Consult Note (Signed)
[facility] Face-to-Face Psychiatry Consult   Reason for Consult:  Psychosis  Referring Physician:  Dr. Wynelle Cleveland Patient Identification: [name] MRN:  [id number] Principal Diagnosis: Post-ictal state [facility]) Diagnosis:   Patient Active Problem List   Diagnosis Date Noted  . Iron deficiency [E61.1] 03/20/2018  . Folate deficiency [E53.8] 03/20/2018  . B12 deficiency [E53.8] 03/20/2018  . SIRS (systemic inflammatory response syndrome) (HCC) [R65.10] 03/19/2018  . Hypokalemia [E87.6] 03/19/2018  . Anemia [D64.9] 03/19/2018  . Seizure disorder ([place]) [G40.909]     Total Time spent with patient: 30 minutes  Subjective:   [name] is a 47 y.o. female patient admitted with SIRS with unknown etiology.  HPI:   Per chart review, patient was admitted with SIRS with unknown etiology. There was concern for NMS since she was given antipsychotic medication yesterday (Saphris 10 mg and Geodon 10 mg on 11/3). She had a fever up to 101.2 and was tachycardic up to 125. She was initially seen in the [facility] by psychiatry and was subsequently admitted to the medical floor for further workup. She has a history of seizure disorder. Per husband, she becomes confused after having seizures. She reportedly had 3 seizures yesterday. She was admitted to [facility] a year ago for similar episode after receiving antipsychotics for altered mental status. She is followed by a neurologist at [facility]. Home medications include Tegretol 200 mg BID and Depakote 250 mg BID. Her husband denies a psychiatric history. She was placed under IVC by her husband for psychosis, SI and making threats to harm others. BAL and UDS were negative on admission.   On interview, [name] reports feeling much better today. She denies SI, HI or AVH. She denies a history of suicide attempts. She denies problems with sleep or appetite. She reports poor medication compliance with her seizure medications. She wanted to  stop taking them due to the inconvenience in location of where she travels for refills. She reports understanding the importance of taking her medications now. She reports a history of seizures since 7 y/o. Her husband was at her bedside with her verbal consent. He reports that she is at her baseline and he has no concerns for her safety.   Past Psychiatric History: Denies   Risk to Self: None. Denies SI.  Risk to Others: None. Denies HI.  Prior Inpatient Therapy: Prior Inpatient Therapy: No Prior Outpatient Therapy: Prior Outpatient Therapy: No Does patient have an ACCT team?: No Does patient have Intensive In-House Services?  : No Does patient have [facility] services? : No Does patient have P4CC services?: No  Past Medical History:  Past Medical History:  Diagnosis Date  . Seizure ([place])   . Seizure disorder [facility])     Past Surgical History:  Procedure Laterality Date  . NO PAST SURGERIES     Family History: History reviewed. No pertinent family history. Family Psychiatric  History: Denies  Social History:  Social History   Substance and Sexual Activity  Alcohol Use Never  . Frequency: Never   Comment: unsure     Social History   Substance and Sexual Activity  Drug Use Never   Comment: unsure    Social History   Socioeconomic History  . Marital status: Single    Spouse name: Not on file  . Number of children: Not on file  . Years of education: Not on file  . Highest education level: Not on file  Occupational History  . Not on file  Social Needs  .  Financial resource strain: Not on file  . Food insecurity:    Worry: Not on file    Inability: Not on file  . Transportation needs:    Medical: Not on file    Non-medical: Not on file  Tobacco Use  . Smoking status: Never Smoker  . Smokeless tobacco: Never Used  Substance and Sexual Activity  . Alcohol use: Never    Frequency: Never    Comment: unsure  . Drug use: Never    Comment: unsure  . Sexual activity:  Not on file  Lifestyle  . Physical activity:    Days per week: Not on file    Minutes per session: Not on file  . Stress: Not on file  Relationships  . Social connections:    Talks on phone: Not on file    Gets together: Not on file    Attends religious service: Not on file    Active member of club or organization: Not on file    Attends meetings of clubs or organizations: Not on file    Relationship status: Not on file  Other Topics Concern  . Not on file  Social History Narrative  . Not on file   Additional Social History: She lives at home with her husband.She has 1 adult son. She is unemployed. She reports social alcohol use and denies illicit substance use.     Allergies:  No Known Allergies  Labs:  Results for orders placed or performed during the hospital encounter of 03/18/18 (from the past 48 hour(s))  Comprehensive metabolic panel     Status: Abnormal   Collection Time: 03/18/18  2:18 PM  Result Value Ref Range   Sodium 139 135 - 145 mmol/L   Potassium 3.6 3.5 - 5.1 mmol/L   Chloride 103 98 - 111 mmol/L   CO2 26 22 - 32 mmol/L   Glucose, Bld 113 (H) 70 - 99 mg/dL   BUN 10 6 - 20 mg/dL   Creatinine, Ser 0.64 0.44 - 1.00 mg/dL   Calcium 9.2 8.9 - 10.3 mg/dL   Total Protein 8.0 6.5 - 8.1 g/dL   Albumin 4.3 3.5 - 5.0 g/dL   AST 24 15 - 41 U/L   ALT 18 0 - 44 U/L   Alkaline Phosphatase 94 38 - 126 U/L   Total Bilirubin 0.6 0.3 - 1.2 mg/dL   GFR calc non Af Amer >60 >60 mL/min   GFR calc Af Amer >60 >60 mL/min    Comment: (NOTE) The eGFR has been calculated using the CKD EPI equation. This calculation has not been validated in all clinical situations. eGFR's persistently <60 mL/min signify possible Chronic Kidney Disease.    Anion gap 10 5 - 15    Comment: Performed at [facility], [place] [address] [place], [place] [address]  Ethanol     Status: None   Collection Time: 03/18/18  2:18 PM  Result Value Ref Range   Alcohol, Ethyl (B) <10 <10  mg/dL    Comment: (NOTE) Lowest detectable limit for serum alcohol is 10 mg/dL. For medical purposes only. Performed at [facility], [place] [address], [place], [place] [address]   Salicylate level     Status: None   Collection Time: 03/18/18  2:18 PM  Result Value Ref Range   Salicylate Lvl <6.5 2.8 - 30.0 mg/dL    Comment: Performed at [facility], [place] [address], [place], [place] [address]  Acetaminophen level  Status: Abnormal   Collection Time: 03/18/18  2:18 PM  Result Value Ref Range   Acetaminophen (Tylenol), Serum <10 (L) 10 - 30 ug/mL    Comment: (NOTE) Therapeutic concentrations vary significantly. A range of 10-30 ug/mL  may be an effective concentration for many patients. However, some  are best treated at concentrations outside of this range. Acetaminophen concentrations >150 ug/mL at 4 hours after ingestion  and >50 ug/mL at 12 hours after ingestion are often associated with  toxic reactions. Performed at [facility], [place] [address] [place], [place] [address]   cbc     Status: Abnormal   Collection Time: 03/18/18  2:18 PM  Result Value Ref Range   WBC 14.7 (H) 4.0 - 10.5 K/uL    Comment: WHITE COUNT CONFIRMED ON SMEAR   RBC 4.42 3.87 - 5.11 MIL/uL   Hemoglobin 11.1 (L) 12.0 - 15.0 g/dL   HCT 36.0 36.0 - 46.0 %   MCV 81.4 80.0 - 100.0 fL   MCH 25.1 (L) 26.0 - 34.0 pg   MCHC 30.8 30.0 - 36.0 g/dL   RDW 16.2 (H) 11.5 - 15.5 %   Platelets 310 150 - 400 K/uL   nRBC 0.0 0.0 - 0.2 %    Comment: Performed at [facility], [place] [address], [place], [place] [address]  Rapid urine drug screen (hospital performed)     Status: None   Collection Time: 03/18/18  2:18 PM  Result Value Ref Range   Opiates NONE DETECTED NONE DETECTED   Cocaine NONE DETECTED NONE DETECTED   Benzodiazepines NONE DETECTED NONE DETECTED   Amphetamines NONE DETECTED NONE DETECTED   Tetrahydrocannabinol NONE DETECTED  NONE DETECTED   Barbiturates NONE DETECTED NONE DETECTED    Comment: (NOTE) DRUG SCREEN FOR MEDICAL PURPOSES ONLY.  IF CONFIRMATION IS NEEDED FOR ANY PURPOSE, NOTIFY LAB WITHIN 5 DAYS. LOWEST DETECTABLE LIMITS FOR URINE DRUG SCREEN Drug Class                     Cutoff (ng/mL) Amphetamine and metabolites    1000 Barbiturate and metabolites    200 Benzodiazepine                 009 Tricyclics and metabolites     300 Opiates and metabolites        300 Cocaine and metabolites        300 THC                            50 Performed at [facility], [place] [address], [place],  [address]   hCG, quantitative, pregnancy     Status: None   Collection Time: 03/18/18  2:18 PM  Result Value Ref Range   hCG, Beta Chain, Quant, S <1 <5 mIU/mL    Comment:          GEST. AGE      CONC.  (mIU/mL)   <=1 WEEK        5 - 50     2 WEEKS       50 - 500     3 WEEKS       100 - 10,000     4 WEEKS     1,000 - 30,000     5 WEEKS     3,500 - 115,000   6-8 WEEKS     12,000 - 270,000  12 WEEKS     15,000 - 220,000        FEMALE AND NON-PREGNANT FEMALE:     LESS THAN 5 mIU/mL Performed at [facility], [place] [address] [place], [place] [address]   CBC with Differential/Platelet     Status: Abnormal   Collection Time: 03/19/18 12:20 PM  Result Value Ref Range   WBC 20.5 (H) 4.0 - 10.5 K/uL   RBC 4.44 3.87 - 5.11 MIL/uL   Hemoglobin 11.1 (L) 12.0 - 15.0 g/dL   HCT 36.3 36.0 - 46.0 %   MCV 81.8 80.0 - 100.0 fL   MCH 25.0 (L) 26.0 - 34.0 pg   MCHC 30.6 30.0 - 36.0 g/dL   RDW 16.4 (H) 11.5 - 15.5 %   Platelets 258 150 - 400 K/uL   nRBC 0.0 0.0 - 0.2 %   Neutrophils Relative % 88 %   Neutro Abs 17.9 (H) 1.7 - 7.7 K/uL   Lymphocytes Relative 4 %   Lymphs Abs 0.9 0.7 - 4.0 K/uL   Monocytes Relative 7 %   Monocytes Absolute 1.5 (H) 0.1 - 1.0 K/uL   Eosinophils Relative 0 %   Eosinophils Absolute 0.0 0.0 - 0.5 K/uL   Basophils Relative 0 %   Basophils  Absolute 0.0 0.0 - 0.1 K/uL   Immature Granulocytes 1 %   Abs Immature Granulocytes 0.23 (H) 0.00 - 0.07 K/uL    Comment: Performed at [facility], [place] [address], [place], [place] [address]  Comprehensive metabolic panel     Status: Abnormal   Collection Time: 03/19/18  2:54 PM  Result Value Ref Range   Sodium 137 135 - 145 mmol/L   Potassium 3.0 (L) 3.5 - 5.1 mmol/L   Chloride 102 98 - 111 mmol/L   CO2 25 22 - 32 mmol/L   Glucose, Bld 125 (H) 70 - 99 mg/dL   BUN 15 6 - 20 mg/dL   Creatinine, Ser 0.75 0.44 - 1.00 mg/dL   Calcium 9.1 8.9 - 10.3 mg/dL   Total Protein 7.8 6.5 - 8.1 g/dL   Albumin 4.0 3.5 - 5.0 g/dL   AST 18 15 - 41 U/L   ALT 16 0 - 44 U/L   Alkaline Phosphatase 93 38 - 126 U/L   Total Bilirubin 0.8 0.3 - 1.2 mg/dL   GFR calc non Af Amer >60 >60 mL/min   GFR calc Af Amer >60 >60 mL/min    Comment: (NOTE) The eGFR has been calculated using the CKD EPI equation. This calculation has not been validated in all clinical situations. eGFR's persistently <60 mL/min signify possible Chronic Kidney Disease.    Anion gap 10 5 - 15    Comment: Performed at [facility], [place] [address], [place], [place] [address]  Culture, blood (Routine x 2)     Status: None (Preliminary result)   Collection Time: 03/19/18  2:54 PM  Result Value Ref Range   Specimen Description BLOOD RIGHT ANTECUBITAL    Special Requests      Blood Culture adequate volume BOTTLES DRAWN AEROBIC AND ANAEROBIC Performed at [facility], [place] [address], [place], [place] [address]    Culture PENDING    Report Status PENDING   Culture, blood (Routine x 2)     Status: None (Preliminary result)   Collection Time: 03/19/18  2:55 PM  Result Value Ref Range   Specimen Description      BLOOD RIGHT ANTECUBITAL Performed  at [facility], [place] [address], [place], [place] [address]    Special Requests      Blood Culture adequate volume BOTTLES DRAWN  AEROBIC AND ANAEROBIC Performed at [facility], [place] [address] [place], [place] [address]    Culture PENDING    Report Status PENDING   CK     Status: None   Collection Time: 03/19/18  2:55 PM  Result Value Ref Range   Total CK 93 38 - 234 U/L    Comment: Performed at [facility], [place] [address] [place], [place] [address]  I-Stat beta hCG blood, ED     Status: None   Collection Time: 03/19/18  2:58 PM  Result Value Ref Range   I-stat hCG, quantitative <5.0 <5 mIU/mL   Comment 3            Comment:   GEST. AGE      CONC.  (mIU/mL)   <=1 WEEK        5 - 50     2 WEEKS       50 - 500     3 WEEKS       100 - 10,000     4 WEEKS     1,000 - 30,000        FEMALE AND NON-PREGNANT FEMALE:     LESS THAN 5 mIU/mL   I-Stat CG4 Lactic Acid, ED     Status: None   Collection Time: 03/19/18  2:59 PM  Result Value Ref Range   Lactic Acid, Venous 1.23 0.5 - 1.9 mmol/L  Urinalysis, Routine w reflex microscopic     Status: Abnormal   Collection Time: 03/19/18  3:25 PM  Result Value Ref Range   Color, Urine YELLOW YELLOW   APPearance CLEAR CLEAR   Specific Gravity, Urine 1.008 1.005 - 1.030   pH 7.0 5.0 - 8.0   Glucose, UA NEGATIVE NEGATIVE mg/dL   Hgb urine dipstick NEGATIVE NEGATIVE   Bilirubin Urine NEGATIVE NEGATIVE   Ketones, ur 5 (A) NEGATIVE mg/dL   Protein, ur NEGATIVE NEGATIVE mg/dL   Nitrite POSITIVE (A) NEGATIVE   Leukocytes, UA NEGATIVE NEGATIVE    Comment: Performed at [facility], [place] [address], [place], [place] [address]  Influenza panel by PCR (type A & B)     Status: None   Collection Time: 03/19/18  4:51 PM  Result Value Ref Range   Influenza A By PCR NEGATIVE NEGATIVE   Influenza B By PCR NEGATIVE NEGATIVE    Comment: (NOTE) The Xpert Xpress Flu assay is intended as an aid in the diagnosis of  influenza and should not be used as a sole basis for treatment.  This  assay is FDA approved for nasopharyngeal swab specimens  only. Nasal  washings and aspirates are unacceptable for Xpert Xpress Flu testing. Performed at [facility], [place] [address], [place], [place] [address]   HIV antibody (Routine Testing)     Status: None   Collection Time: 03/19/18  6:26 PM  Result Value Ref Range   HIV Screen 4th Generation wRfx Non Reactive Non Reactive    Comment: (NOTE) Performed At: [facility] [place], [place] [id number] Rush Farmer MD GY:[id number]   CBC     Status: Abnormal   Collection Time: 03/19/18  6:26 PM  Result Value Ref Range   WBC 18.1 (H) 4.0 - 10.5 K/uL   RBC 3.66 (L) 3.87 -  5.11 MIL/uL   Hemoglobin 9.2 (L) 12.0 - 15.0 g/dL   HCT 30.3 (L) 36.0 - 46.0 %   MCV 82.8 80.0 - 100.0 fL   MCH 25.1 (L) 26.0 - 34.0 pg   MCHC 30.4 30.0 - 36.0 g/dL   RDW 16.6 (H) 11.5 - 15.5 %   Platelets 223 150 - 400 K/uL   nRBC 0.0 0.0 - 0.2 %    Comment: Performed at [facility], [place] [address], [place], [place] [address]  Creatinine, serum     Status: None   Collection Time: 03/19/18  6:26 PM  Result Value Ref Range   Creatinine, Ser 0.58 0.44 - 1.00 mg/dL   GFR calc non Af Amer >60 >60 mL/min   GFR calc Af Amer >60 >60 mL/min    Comment: (NOTE) The eGFR has been calculated using the CKD EPI equation. This calculation has not been validated in all clinical situations. eGFR's persistently <60 mL/min signify possible Chronic Kidney Disease. Performed at [facility], [place] [address], [place], [place] [address]   Iron and TIBC     Status: Abnormal   Collection Time: 03/19/18  6:26 PM  Result Value Ref Range   Iron 11 (L) 28 - 170 ug/dL   TIBC 300 250 - 450 ug/dL   Saturation Ratios 4 (L) 10.4 - 31.8 %   UIBC 289 ug/dL    Comment: Performed at [facility], [place] [address], [place], [place] [address]  Ferritin     Status: None   Collection Time: 03/19/18  6:26 PM  Result Value Ref Range   Ferritin 12 11 -  307 ng/mL    Comment: Performed at [facility], [place] [address], [place], [place] [address]  Reticulocytes     Status: Abnormal   Collection Time: 03/19/18  6:26 PM  Result Value Ref Range   Retic Ct Pct 1.2 0.4 - 3.1 %   RBC. 3.66 (L) 3.87 - 5.11 MIL/uL   Retic Count, Absolute 42.1 19.0 - 186.0 K/uL   Immature Retic Fract 10.6 2.3 - 15.9 %    Comment: Performed at [facility], [place] [address] [place], [place] [address]  CK     Status: None   Collection Time: 03/19/18  6:26 PM  Result Value Ref Range   Total CK 70 38 - 234 U/L    Comment: Performed at [facility], [place] [address], [place], [place] [address]  Comprehensive metabolic panel     Status: Abnormal   Collection Time: 03/20/18  5:14 AM  Result Value Ref Range   Sodium 140 135 - 145 mmol/L   Potassium 3.9 3.5 - 5.1 mmol/L    Comment: DELTA CHECK NOTED REPEATED TO VERIFY NO VISIBLE HEMOLYSIS    Chloride 111 98 - 111 mmol/L   CO2 24 22 - 32 mmol/L   Glucose, Bld 93 70 - 99 mg/dL   BUN 8 6 - 20 mg/dL   Creatinine, Ser 0.57 0.44 - 1.00 mg/dL   Calcium 8.1 (L) 8.9 - 10.3 mg/dL   Total Protein 5.7 (L) 6.5 - 8.1 g/dL   Albumin 2.9 (L) 3.5 - 5.0 g/dL   AST 11 (L) 15 - 41 U/L   ALT 11 0 - 44 U/L   Alkaline Phosphatase 66 38 - 126 U/L   Total Bilirubin 0.6 0.3 - 1.2 mg/dL   GFR calc non Af Amer >60 >60 mL/min   GFR calc  Af Amer >60 >60 mL/min    Comment: (NOTE) The eGFR has been calculated using the CKD EPI equation. This calculation has not been validated in all clinical situations. eGFR's persistently <60 mL/min signify possible Chronic Kidney Disease.    Anion gap 5 5 - 15    Comment: Performed at [facility], [place] [address] [place], [place] [address]  Vitamin B12     Status: Abnormal   Collection Time: 03/20/18  5:14 AM  Result Value Ref Range   Vitamin B-12 172 (L) 180 - 914 pg/mL    Comment: (NOTE) This assay is not validated for  testing neonatal or myeloproliferative syndrome specimens for Vitamin B12 levels. Performed at [facility], [place] [address], [place], [place] [address]   Folate     Status: Abnormal   Collection Time: 03/20/18  5:14 AM  Result Value Ref Range   Folate 4.7 (L) >5.9 ng/mL    Comment: Performed at [facility], [place] [address] [place], [place] [address]  Magnesium     Status: None   Collection Time: 03/20/18  5:14 AM  Result Value Ref Range   Magnesium 1.9 1.7 - 2.4 mg/dL    Comment: Performed at [facility], [place] [address] [place], [place] [address]    Current Facility-Administered Medications  Medication Dose Route Frequency Provider Last Rate Last Dose  . 0.9 % NaCl with KCl 40 mEq / L  infusion   Intravenous Continuous Debbe Odea, MD 100 mL/hr at 03/20/18 1000    . acetaminophen (TYLENOL) tablet 650 mg  650 mg Oral Q6H PRN Debbe Odea, MD       Or  . acetaminophen (TYLENOL) suppository 650 mg  650 mg Rectal Q6H PRN Debbe Odea, MD      . Ampicillin-Sulbactam (UNASYN) 3 g in sodium chloride 0.9 % 100 mL IVPB  3 g Intravenous Q6H Debbe Odea, MD   Stopped at 03/20/18 0826  . azithromycin (ZITHROMAX) 500 mg in sodium chloride 0.9 % 250 mL IVPB  500 mg Intravenous Q24H Lacretia Leigh, MD 250 mL/hr at 03/19/18 1648 500 mg at 03/19/18 1648  . carbamazepine (TEGRETOL) tablet 100 mg  100 mg Oral BID Derrill [place], NP   100 mg at 03/20/18 1117  . cyanocobalamin ((VITAMIN B-12)) injection 1,000 mcg  1,000 mcg Subcutaneous Daily Debbe Odea, MD   1,000 mcg at 03/20/18 1122  . enoxaparin (LOVENOX) injection 30 mg  30 mg Subcutaneous Q24H Debbe Odea, MD   30 mg at 03/19/18 2350  . folic acid injection 1 mg  1 mg Intravenous Daily Debbe Odea, MD   1 mg at 03/20/18 1116  . ibuprofen (ADVIL,MOTRIN) tablet 400 mg  400 mg Oral Q6H PRN Debbe Odea, MD      . ketorolac (TORADOL) 30 MG/ML injection 30 mg  30 mg Intravenous Q6H  PRN Rizwan, Eunice Blase, MD      . ondansetron (ZOFRAN) tablet 4 mg  4 mg Oral Q6H PRN Debbe Odea, MD       Or  . ondansetron (ZOFRAN) injection 4 mg  4 mg Intravenous Q6H PRN Rizwan, Eunice Blase, MD      . valproic acid (DEPAKENE) 250 MG capsule 250 mg  250 mg Oral BID Debbe Odea, MD   250 mg at 03/20/18 1124  . ziprasidone (GEODON) injection 10 mg  10 mg Intramuscular Once Patrecia Pour, NP   Stopped at 03/18/18 1626    Musculoskeletal: Strength & Muscle  Tone: within normal limits Gait & Station: UTA since patient is lying in bed. Patient leans: N/A  Psychiatric Specialty Exam: Physical Exam  Nursing note and vitals reviewed. Constitutional: She is oriented to person, place, and time. She appears well-developed and well-nourished.  HENT:  Head: Normocephalic and atraumatic.  Neck: Normal range of motion.  Respiratory: Effort normal.  Musculoskeletal: Normal range of motion.  Neurological: She is alert and oriented to person, place, and time.  Psychiatric: She has a normal mood and affect. Her speech is normal and behavior is normal. Judgment and thought content normal. Cognition and memory are normal.    Review of Systems  Constitutional: Negative for chills and fever.  Cardiovascular: Negative for chest pain.  Gastrointestinal: Negative for abdominal pain, constipation, diarrhea, nausea and vomiting.  Psychiatric/Behavioral: Negative for depression, hallucinations, substance abuse and suicidal ideas. The patient does not have insomnia.   All other systems reviewed and are negative.   Blood pressure 102/68, pulse 84, temperature 98 F (36.7 C), temperature source Oral, resp. rate 16, height '4\' 8"'  (1.422 m), weight 44.5 kg, SpO2 98 %.Body mass index is 21.97 kg/m.  General Appearance: Fairly Groomed, middle aged, Hispanic female, wearing a hospital gown who is lying in bed. NAD.   Eye Contact:  Good  Speech:  Clear and Coherent and Normal Rate  Volume:  Normal  Mood:  Euthymic   Affect:  Appropriate and Congruent  Thought Process:  Goal Directed, Linear and Descriptions of Associations: Intact  Orientation:  Full (Time, Place, and Person)  Thought Content:  Logical  Suicidal Thoughts:  No  Homicidal Thoughts:  No  Memory:  Immediate;   Good Recent;   Good Remote;   Good  Judgement:  Fair  Insight:  Fair  Psychomotor Activity:  Normal  Concentration:  Concentration: Good and Attention Span: Good  Recall:  Good  Fund of Knowledge:  Good  Language:  Good  Akathisia:  No  Handed:  Right  AIMS (if indicated):   N/A  Assets:  Communication Skills Desire for Improvement Financial Resources/Insurance Housing Intimacy Leisure Time Social Support  ADL's:  Intact  Cognition:  WNL  Sleep:   Okay   Assessment:  [name] is a 47 y.o. female who was admitted with psychosis and SI in the setting of seizure. She denies SI, HI or AVH. Her husband reports that she is at her baseline. Her acute change in mental status was likely attributed to seizures in the setting of poor medication compliance. She does not warrant inpatient psychiatric hospitalization at this time.   Treatment Plan Summary: -Patient educated about the importance of medication compliance for seizure prophylaxis.  -Continue home medications: Depakote 250 mg BID and Tegretol 200 mg BID for seizure prophylaxis.  -Patient does not have active mood symptoms or psychosis. She does not warrant psychotropic medications at this time.  -EKG reviewed and QTc 450 on 11/4. Please closely monitor when starting or increasing QTc prolonging agents.  -Psychiatry will sign off on patient at this time. Please consult psychiatry again as needed.   Disposition: No evidence of imminent risk to self or others at present.   Patient does not meet criteria for psychiatric inpatient admission.  Faythe Dingwall, DO 03/20/2018 1:00 PM
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
## 2018-03-20 NOTE — Progress Notes (Signed)
Discharge instructions discussed with patient and family, verbalized agreement and understandng

## 2018-03-21 ENCOUNTER — Encounter (INDEPENDENT_AMBULATORY_CARE_PROVIDER_SITE_OTHER): Payer: Self-pay | Admitting: Orthopaedic Surgery

## 2018-03-24 LAB — CULTURE, BLOOD (ROUTINE X 2)
Culture: NO GROWTH
Culture: NO GROWTH
SPECIAL REQUESTS: ADEQUATE
Special Requests: ADEQUATE
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
